# Patient Record
Sex: Female | Born: 1965
Health system: Southern US, Community
[De-identification: ages and names within clinical notes are randomized; demographics above are authoritative.]

## PROBLEM LIST (undated history)

## (undated) DIAGNOSIS — D219 Benign neoplasm of connective and other soft tissue, unspecified: Secondary | ICD-10-CM

## (undated) DIAGNOSIS — Z973 Presence of spectacles and contact lenses: Secondary | ICD-10-CM

## (undated) DIAGNOSIS — F329 Major depressive disorder, single episode, unspecified: Secondary | ICD-10-CM

## (undated) DIAGNOSIS — J189 Pneumonia, unspecified organism: Secondary | ICD-10-CM

## (undated) DIAGNOSIS — F32A Depression, unspecified: Secondary | ICD-10-CM

## (undated) DIAGNOSIS — I10 Essential (primary) hypertension: Secondary | ICD-10-CM

## (undated) DIAGNOSIS — R011 Cardiac murmur, unspecified: Secondary | ICD-10-CM

## (undated) DIAGNOSIS — Z8489 Family history of other specified conditions: Secondary | ICD-10-CM

## (undated) DIAGNOSIS — K635 Polyp of colon: Secondary | ICD-10-CM

## (undated) HISTORY — DX: Depression, unspecified: F32.A

## (undated) HISTORY — PX: NOVASURE ABLATION: SHX5394

## (undated) HISTORY — DX: Essential (primary) hypertension: I10

## (undated) HISTORY — PX: TUBAL LIGATION: SHX77

## (undated) HISTORY — DX: Cardiac murmur, unspecified: R01.1

## (undated) HISTORY — PX: BREAST BIOPSY: SHX20

## (undated) HISTORY — DX: Benign neoplasm of connective and other soft tissue, unspecified: D21.9

## (undated) HISTORY — DX: Major depressive disorder, single episode, unspecified: F32.9

## (undated) HISTORY — DX: Polyp of colon: K63.5

---

## 2003-02-04 ENCOUNTER — Encounter: Payer: Self-pay | Admitting: Family Medicine

## 2003-02-04 ENCOUNTER — Ambulatory Visit (HOSPITAL_COMMUNITY): Admission: RE | Admit: 2003-02-04 | Discharge: 2003-02-04 | Payer: Self-pay | Admitting: Family Medicine

## 2005-06-10 ENCOUNTER — Emergency Department (HOSPITAL_COMMUNITY): Admission: EM | Admit: 2005-06-10 | Discharge: 2005-06-10 | Payer: Self-pay | Admitting: *Deleted

## 2008-12-31 ENCOUNTER — Other Ambulatory Visit: Admission: RE | Admit: 2008-12-31 | Discharge: 2008-12-31 | Payer: Self-pay | Admitting: Obstetrics and Gynecology

## 2009-02-16 ENCOUNTER — Encounter: Payer: Self-pay | Admitting: Obstetrics and Gynecology

## 2009-02-16 ENCOUNTER — Ambulatory Visit (HOSPITAL_COMMUNITY): Admission: RE | Admit: 2009-02-16 | Discharge: 2009-02-17 | Payer: Self-pay | Admitting: Obstetrics and Gynecology

## 2009-02-16 HISTORY — PX: LAPAROSCOPIC TOTAL HYSTERECTOMY: SUR800

## 2011-02-22 LAB — CBC
HCT: 28.8 % — ABNORMAL LOW (ref 36.0–46.0)
HCT: 37.9 % (ref 36.0–46.0)
Hemoglobin: 12.9 g/dL (ref 12.0–15.0)
Hemoglobin: 9.8 g/dL — ABNORMAL LOW (ref 12.0–15.0)
MCHC: 33.9 g/dL (ref 30.0–36.0)
MCHC: 34 g/dL (ref 30.0–36.0)
MCV: 97.2 fL (ref 78.0–100.0)
MCV: 97.6 fL (ref 78.0–100.0)
Platelets: 203 10*3/uL (ref 150–400)
Platelets: 262 10*3/uL (ref 150–400)
RBC: 2.96 MIL/uL — ABNORMAL LOW (ref 3.87–5.11)
RBC: 3.89 MIL/uL (ref 3.87–5.11)
RDW: 13.3 % (ref 11.5–15.5)
RDW: 13.4 % (ref 11.5–15.5)
WBC: 7.4 10*3/uL (ref 4.0–10.5)
WBC: 8 10*3/uL (ref 4.0–10.5)

## 2011-02-22 LAB — ABO/RH: ABO/RH(D): A POS

## 2011-02-22 LAB — PREGNANCY, URINE: Preg Test, Ur: NEGATIVE

## 2011-02-22 LAB — TYPE AND SCREEN
ABO/RH(D): A POS
Antibody Screen: NEGATIVE

## 2011-03-28 NOTE — Op Note (Signed)
NAME:  Kathryn Church, Kathryn Church                  ACCOUNT NO.:  [id number]   MEDICAL RECORD NO.:  [id number]          PATIENT TYPE:  AMB   LOCATION:  DAY                          FACILITY:  [facility]   PHYSICIAN:  Cynthia P. Romine, M.D.DATE OF BIRTH:  August 09, 1966   DATE OF PROCEDURE:  02/16/2009  DATE OF DISCHARGE:                               OPERATIVE REPORT   PREOPERATIVE DIAGNOSES:  1. Menorrhagia.  2. Uterine fibroids.   POSTOPERATIVE DIAGNOSES:  1. Menorrhagia.  2. Uterine fibroids.  Path pending.   PROCEDURE:  Robotic total laparoscopic hysterectomy.   SURGEON:  Dr. Aram Beecham Romine.   ASSISTANT:  Dr. Leda Quail.   ANESTHESIA:  General endotracheal.   BLOOD LOSS:  125 mL.   COMPLICATIONS:  None.   PROCEDURE:  The patient was taken to the operating room and after the  induction of adequate general endotracheal anesthesia, was positioned on  the table and prepped and draped in the usual fashion.  While in dorsal  lithotomy position, a posterior weight and anterior Deaver were placed,  cervix was grasped with a single-tooth tenaculum.  Uterus sounded to 10  cm.  A 10-cm Rumi manipulator with a medium KOH ring was inserted  without difficulty.  The occluder was inflated with 60 mL of air.  A  Foley catheter was then placed.  The legs brought down to a very low  lithotomy position.  Attention was next turned to the abdomen.  A 3-cm  incision was made approximately 3 fingerbreadths above the umbilicus  after infiltrating the skin with 0.25% Marcaine.  The subcutaneous  tissue was bluntly dissected.  The fascia was grasped with Kochers and  entered with Mayo scissors.  The underlying peritoneum was elevated  between hemostats and entered atraumatically.  A pursestring suture of 0  Vicryl was placed around the fascia.  Hasson trocar was then placed into  the abdomen and secured with the pursestring suture around the fascia.  Pneumoperitoneum was then created.  The uterus was seen and  felt to be  quite large, approximately 12-14-week size.  The ovaries on each side  appeared normal as did the tube.  The abdomen was insufflated with the  automatic insufflator with pressure set at 15 mmHg.  Four other small, 1-  2-cm, incisions approximately 10 cm lateral to the supraumbilical  incision.  Again, 10 cm lateral and inferior to those, three 8-mm  robotic trocars were inserted under direct visualization and the 10-12-  mm assistance port was inserted into the right lower quadrant under  direct visualization.  The patient was placed in steep Trendelenburg.  The robot was then docked from the side to allow better access to the  vagina.  A PK Gyrus, the monopolar scissors and a cobra grasper were  used to support the robotic ports.  The surgeon then proceeded to the  console.  The round ligament and utero-ovarian ligaments and tube on  each side were cauterized using the PK Gyrus cut using the monopolar  scissors.  This was done bilaterally.  The anterior and posterior leaf  of the broad  ligament were taken down sharply with monopolar scissors.  The bladder was dissected sharply off the vagina until the KOH ring was  clearly visualized.  The uterine arteries on each side were  skeletonized, coagulated with the PK Gyrus and then cut.  The colpotomy  incision was made using the monopolar cautery circumferentially around  the KOH ring.  Once the specimen was removed from the patient's vaginal  cuff, the surgeon scrubbed and vaginally morcellated the uterus and  removed it through the vagina, where all the pieces were put together  and weighed in the operating room, it weighed 844 grams.  The cuff was  closed vaginally with pop-offs of 0 Vicryl figure-of-eight sutures  across the cuff.  Good hemostasis was noted.  The operator then went  back to the robot.  The assistant used the irrigator aspirator to  irrigate the cuff, it was hemostatic.  The pedicles were all inspected  and  were hemostatic.  The ureters were visualized bilaterally and seen  to peristals.  Instruments were then removed from the abdomen and the  trocars were also removed under direct visualization.  Pneumoperitoneum  was allowed to escape.  The pursestring suture at the subumbilical  incision was tied to close the fascia.  Two sutures of 0 Vicryl were  used to close the fascia at the assistance port.  The skin of the  assistance port and the supraumbilical incision were closed  subcuticularly with 4-0 Vicryl, Rapide and Steri-Strips.  The remaining  3 incisions were just closed with Steri-Strips and Benzoin and dressing  applied.  The procedure was terminated.  Instruments were all removed  from the vagina.  The patient taken in satisfactory condition to post  anesthesia recovery.  Sponge and instrument counts were correct.      Cynthia P. Romine, M.D.  Electronically Signed     CPR/MEDQ  D:  02/16/2009  T:  02/16/2009  Job:  [id number]

## 2012-10-24 ENCOUNTER — Other Ambulatory Visit (HOSPITAL_COMMUNITY): Payer: Self-pay | Admitting: Physician Assistant

## 2012-10-24 DIAGNOSIS — L729 Follicular cyst of the skin and subcutaneous tissue, unspecified: Secondary | ICD-10-CM

## 2012-10-25 ENCOUNTER — Other Ambulatory Visit (HOSPITAL_COMMUNITY): Payer: Self-pay | Admitting: Physician Assistant

## 2012-10-25 ENCOUNTER — Ambulatory Visit (HOSPITAL_COMMUNITY)
Admission: RE | Admit: 2012-10-25 | Discharge: 2012-10-25 | Disposition: A | Discharge status: Home / Self Care | Payer: BC Managed Care – PPO | Source: Ambulatory Visit | Attending: Physician Assistant | Admitting: Physician Assistant

## 2012-10-25 DIAGNOSIS — L729 Follicular cyst of the skin and subcutaneous tissue, unspecified: Secondary | ICD-10-CM

## 2012-10-25 DIAGNOSIS — L723 Sebaceous cyst: Secondary | ICD-10-CM | POA: Insufficient documentation

## 2013-04-08 ENCOUNTER — Encounter: Payer: Self-pay | Admitting: Obstetrics & Gynecology

## 2013-04-08 ENCOUNTER — Ambulatory Visit (INDEPENDENT_AMBULATORY_CARE_PROVIDER_SITE_OTHER): Payer: BC Managed Care – PPO | Admitting: Obstetrics & Gynecology

## 2013-04-08 VITALS — BP 110/72 | HR 68 | Resp 16 | Ht 66.0 in | Wt 130.0 lb

## 2013-04-08 DIAGNOSIS — Z01419 Encounter for gynecological examination (general) (routine) without abnormal findings: Secondary | ICD-10-CM

## 2013-04-08 DIAGNOSIS — Z23 Encounter for immunization: Secondary | ICD-10-CM

## 2013-04-08 DIAGNOSIS — Z0000 Encounter for general adult medical examination without abnormal findings: Secondary | ICD-10-CM

## 2013-04-08 LAB — POCT URINALYSIS DIPSTICK
Blood, UA: NEGATIVE
Glucose, UA: NEGATIVE

## 2013-04-08 MED ORDER — DULOXETINE HCL 30 MG PO CPEP: 30.0000 mg | ORAL_CAPSULE | Freq: Every day | ORAL | Status: DC

## 2013-04-08 MED ORDER — DOXYCYCLINE HYCLATE 100 MG PO CAPS: 100.0000 mg | ORAL_CAPSULE | Freq: Two times a day (BID) | ORAL | Status: DC

## 2013-04-08 NOTE — Patient Instructions (Signed)

## 2013-04-08 NOTE — Addendum Note (Signed)
Addended by: Dorinda Hill on: 04/08/2013 03:55 PM   Modules accepted: Orders

## 2013-04-08 NOTE — Progress Notes (Signed)
47 y.o. G3P3 SingleCaucasianF here for annual exam.  Expecting a new granddaughter in August.  Needs TDaP.  No vaginal bleeding.  Reports going to ER for UTI in the last year.  Had new sexual partner this year.  She feels this is the cause.    Having more issues with hot flashes.  Also, having night sweats.  This is disturbing her sleep.  Wants some suggestions about OTC products.  Doesn't want to be estrogen.  No LMP recorded. Patient has had a hysterectomy.          Sexually active: yes  The current method of family planning is status post hysterectomy.    Exercising: no  The patient does not participate in regular exercise at present. Smoker:  yes  Health Maintenance: Pap:  12/31/08 History of abnormal Pap:yes, 2008--ASCUS +HR HPV MMG:  01/01/13 Colonoscopy:  n/a BMD:  n/a TDaP:  Wants today Screening Labs: Done with PCP 2013, Urine today:Normal   reports that she has been smoking Cigarettes.  She has been smoking about 0.00 packs per day. She has never used smokeless tobacco. She reports that she does not drink alcohol or use illicit drugs.  Past Medical History  Diagnosis Date  . Fibroid     Past Surgical History  Procedure Laterality Date  . Abdominal hysterectomy    . Novasure ablation      Current Outpatient Prescriptions  Medication Sig Dispense Refill  . DULoxetine (CYMBALTA) 30 MG capsule Take 30 mg by mouth daily.       No current facility-administered medications for this visit.    Family History  Problem Relation Age of Onset  . Rheum arthritis Mother   . Hypertension Mother   . Alcoholism Father   . Stroke Maternal Grandmother     mini strokes  . Heart disease Maternal Grandfather     pacemaker    ROS:  Pertinent items are noted in HPI.  Otherwise, a comprehensive ROS was negative.  Exam:   BP 110/72  Pulse 68  Resp 16  Ht 5\' 6"  (1.676 m)  Wt 130 lb (58.968 kg)  BMI 20.99 kg/m2  Weight change: +4lbs   Height: 5\' 6"  (167.6 cm)  Ht Readings from  Last 3 Encounters:  04/08/13 5\' 6"  (1.676 m)    General appearance: alert, cooperative and appears stated age Head: Normocephalic, without obvious abnormality, atraumatic Neck: no adenopathy, supple, symmetrical, trachea midline and thyroid normal to inspection and palpation Lungs: clear to auscultation bilaterally Breasts: normal appearance, no masses or tenderness Heart: regular rate and rhythm Abdomen: soft, non-tender; bowel sounds normal; no masses,  no organomegaly Extremities: extremities normal, atraumatic, no cyanosis or edema Skin: Skin color, texture, turgor normal. No rashes or lesions Lymph nodes: Cervical, supraclavicular, and axillary nodes normal. No abnormal inguinal nodes palpated Neurologic: Grossly normal   Pelvic: External genitalia:  no lesions              Urethra:  normal appearing urethra with no masses, tenderness or lesions              Bartholins and Skenes: normal                 Vagina: normal appearing vagina with normal color and discharge, no lesions              Cervix: absent              Pap taken: no Bimanual Exam:  Uterus:  uterus absent  Adnexa: normal adnexa and no mass, fullness, tenderness               Rectovaginal: Confirms               Anus:  normal sphincter tone, no lesions  A:  Well Woman with normal exam H/o TLH, ovaries remain Vasomotor symptoms, FSH 80 5/13. Recent tick bites  P:   Mammogram yearly. Planning fasting labs next year here.  Will schedule an early morning appt. Cymbalta 30mg  qd to pharmacy. Doxycycline 100mg  bid x 10 days.  RX to pharmacy. Tdap given today. return annually or prn  An After Visit Summary was printed and given to the patient.

## 2013-12-10 ENCOUNTER — Encounter: Payer: Self-pay | Admitting: Obstetrics & Gynecology

## 2014-05-01 ENCOUNTER — Ambulatory Visit: Payer: BC Managed Care – PPO | Admitting: Obstetrics & Gynecology

## 2014-05-05 ENCOUNTER — Encounter: Payer: Self-pay | Admitting: Obstetrics & Gynecology

## 2014-05-07 ENCOUNTER — Ambulatory Visit (INDEPENDENT_AMBULATORY_CARE_PROVIDER_SITE_OTHER): Payer: BC Managed Care – PPO | Admitting: Obstetrics & Gynecology

## 2014-05-07 ENCOUNTER — Encounter: Payer: Self-pay | Admitting: Obstetrics & Gynecology

## 2014-05-07 VITALS — BP 118/64 | HR 64 | Resp 16 | Ht 66.5 in | Wt 132.0 lb

## 2014-05-07 DIAGNOSIS — Z0000 Encounter for general adult medical examination without abnormal findings: Secondary | ICD-10-CM

## 2014-05-07 DIAGNOSIS — Z01419 Encounter for gynecological examination (general) (routine) without abnormal findings: Secondary | ICD-10-CM

## 2014-05-07 LAB — COMPREHENSIVE METABOLIC PANEL
ALBUMIN: 4.4 g/dL (ref 3.5–5.2)
ALK PHOS: 69 U/L (ref 39–117)
ALT: 13 U/L (ref 0–35)
AST: 15 U/L (ref 0–37)
BILIRUBIN TOTAL: 0.6 mg/dL (ref 0.2–1.2)
BUN: 11 mg/dL (ref 6–23)
CO2: 28 mEq/L (ref 19–32)
Calcium: 9.9 mg/dL (ref 8.4–10.5)
Chloride: 106 mEq/L (ref 96–112)
Creat: 0.77 mg/dL (ref 0.50–1.10)
GLUCOSE: 92 mg/dL (ref 70–99)
POTASSIUM: 4.3 meq/L (ref 3.5–5.3)
SODIUM: 138 meq/L (ref 135–145)
TOTAL PROTEIN: 6.8 g/dL (ref 6.0–8.3)

## 2014-05-07 LAB — LIPID PANEL
CHOL/HDL RATIO: 3.9 ratio
CHOLESTEROL: 164 mg/dL (ref 0–200)
HDL: 42 mg/dL (ref >39)
LDL CALC: 102 mg/dL — AB (ref 0–99)
Triglycerides: 98 mg/dL (ref <150)
VLDL: 20 mg/dL (ref 0–40)

## 2014-05-07 MED ORDER — DULOXETINE HCL 30 MG PO CPEP: 30.0000 mg | ORAL_CAPSULE | Freq: Every day | ORAL | Status: DC

## 2014-05-07 NOTE — Progress Notes (Signed)
48 y.o. G3P3 SingleCaucasianF here for annual exam.  No vaginal bleeding.  Having some hot flashes.  Having a little more issues with incontinence with kick boxing.  Not sexually active.  Has an almost one year old granddaughter--Elena Faith.    Has some questions about the surgery she had done at [facility].  States she getting calls from lawyers, letters, and she is not really sure what this is about.  Reviewed operative note with patient.  No evidence of use of mesh.  I am not sure why she is getting this communication.   Patient's last menstrual period was 02/16/2009.          Sexually active: no  The current method of family planning is status post hysterectomy.    Exercising: yes  kickboxing Smoker:  Yes one PPD  Health Maintenance: Pap:  12/31/08 WNL History of abnormal Pap:  no MMG:  [facility], states she will still schedule Colonoscopy:  none BMD:   none TDaP:  04/08/13 Screening Labs: today, Hb today: 14.0, Urine today: PROTEIN-trace, RBC-trace, PH-7   reports that she has been smoking Cigarettes.  She has been smoking about 1.00 pack per day. She has never used smokeless tobacco. She reports that she does not drink alcohol or use illicit drugs.  Past Medical History  Diagnosis Date  . Fibroid   . Depression     Past Surgical History  Procedure Laterality Date  . Abdominal hysterectomy  02/16/09    R-TLH  . Novasure ablation    . Tubal ligation    . Breast biopsy      benign    Current Outpatient Prescriptions  Medication Sig Dispense Refill  . DULoxetine (CYMBALTA) 30 MG capsule Take 1 capsule (30 mg total) by mouth daily.  90 capsule  4   No current facility-administered medications for this visit.    Family History  Problem Relation Age of Onset  . Rheum arthritis Mother   . Hypertension Mother   . Alcoholism Father   . Stroke Maternal Grandmother     mini strokes  . Heart disease Maternal Grandfather     pacemaker  . Other Brother    vasculitis disease-unsure of all diag  . Diabetes Maternal Grandfather   . Heart disease Paternal Grandfather     ROS:  Pertinent items are noted in HPI.  Otherwise, a comprehensive ROS was negative.  Exam:   BP 118/64  Pulse 64  Resp 16  Ht 5' 6.5" (1.689 m)  Wt 132 lb (59.875 kg)  BMI 20.99 kg/m2  LMP 02/16/2009     Height: 5' 6.5" (168.9 cm)  Ht Readings from Last 3 Encounters:  05/07/14 5' 6.5" (1.689 m)  04/08/13 5\' 6"  (1.676 m)    General appearance: alert, cooperative and appears stated age Head: Normocephalic, without obvious abnormality, atraumatic Neck: no adenopathy, supple, symmetrical, trachea midline and thyroid normal to inspection and palpation Lungs: clear to auscultation bilaterally Breasts: normal appearance, no masses or tenderness Heart: regular rate and rhythm Abdomen: soft, non-tender; bowel sounds normal; no masses,  no organomegaly Extremities: extremities normal, atraumatic, no cyanosis or edema Skin: Skin color, texture, turgor normal. No rashes or lesions Lymph nodes: Cervical, supraclavicular, and axillary nodes normal. No abnormal inguinal nodes palpated Neurologic: Grossly normal   Pelvic: External genitalia:  no lesions              Urethra:  normal appearing urethra with no masses, tenderness or lesions  Bartholins and Skenes: normal                 Vagina: normal appearing vagina with normal color and discharge, no lesions              Cervix: absent              Pap taken: no Bimanual Exam:  Uterus:  uterus absent              Adnexa: normal adnexa and no mass, fullness, tenderness               Rectovaginal: Confirms               Anus:  normal sphincter tone, no lesions  A:  Well Woman with normal exam  H/o TLH, ovaries remain  Vasomotor symptoms, FSH 80 5/13.  Increased urinary incontinence  P: Mammogram yearly.  CMP, TSH, Vit D Cymbalta 30mg  qd to pharmacy.  90 day supply/4RF D/W pt Kegel exercises.  PT referral  discussed.  Declines for now. return annually or prn  An After Visit Summary was printed and given to the patient.

## 2014-05-08 ENCOUNTER — Telehealth: Payer: Self-pay

## 2014-05-08 LAB — VITAMIN D 25 HYDROXY (VIT D DEFICIENCY, FRACTURES): Vit D, 25-Hydroxy: 55 ng/mL (ref 30–89)

## 2014-05-08 LAB — TSH: TSH: 0.872 u[IU]/mL (ref 0.350–4.500)

## 2014-05-08 LAB — HEMOGLOBIN, FINGERSTICK: Hemoglobin, fingerstick: 14 g/dL (ref 12.0–16.0)

## 2014-05-08 NOTE — Telephone Encounter (Signed)
Patient notified of all results.//kn 

## 2014-05-08 NOTE — Telephone Encounter (Signed)
Message copied by Robley Fries on Fri May 08, 2014  2:52 PM ------      Message from: Megan Salon      Created: Fri May 08, 2014 12:19 PM       Inform pt cholesterol is fine.  LDLs are a tiny but above normal but HDLs are fine.  TGs are normal.  Ratio is good.  TSH, CMP, and Vit D all normal. ------

## 2014-05-08 NOTE — Telephone Encounter (Signed)
Lmtcb//kn 

## 2014-05-08 NOTE — Telephone Encounter (Signed)
Returning a call to Kathryn Church °

## 2014-06-30 ENCOUNTER — Other Ambulatory Visit: Payer: Self-pay | Admitting: Obstetrics & Gynecology

## 2014-09-14 ENCOUNTER — Encounter: Payer: Self-pay | Admitting: Obstetrics & Gynecology

## 2014-12-06 IMAGING — US US ABDOMEN LIMITED
1 series · 1 of 1 positions shown · non-contrast
Comparison: None.

CLINICAL DATA: Evaluate knot on the back.

ABDOMEN ULTRASOUND LIMITED
TECHNIQUE: Ultrasound was performed over the area of interest.

[Series 1: us abdomen limited · 0.08mm/px · 1 of 1 slices shown]
[im 1/1]
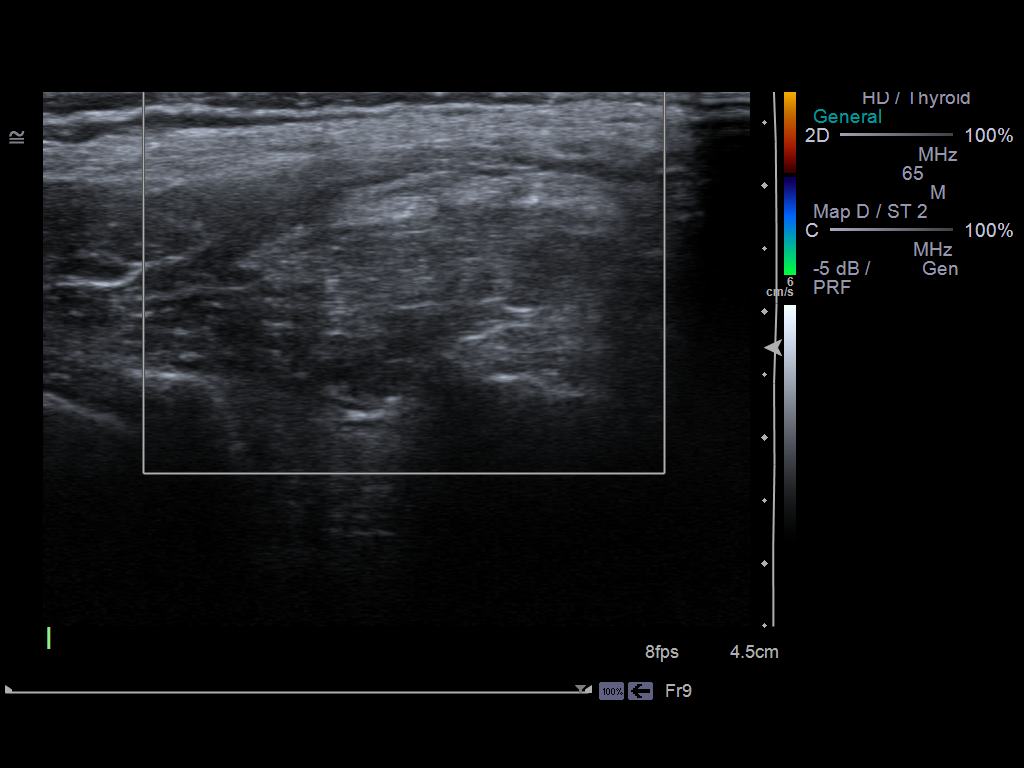

[1 of 1 positions shown; findings below may reference images not displayed]

FINDINGS: The area of interest is in the left back paraspinal
region.  There is an ill-defined solid and slightly hyperechoic
structure that corresponds with the palpable finding.  This area
measures 3.9 x 1.0 x 2.4 cm.  There is no significant vascular flow
within the structure.  The structure appears to be within the
paraspinal musculature.
IMPRESSION: There is a 3.9 cm solid lesion in the area of concern.  The lesion
appears to be within the paraspinal muscles and this could
represent an intramuscular lipoma.  However, this solid lesion is
nonspecific by ultrasound.

## 2014-12-06 IMAGING — US US ABDOMEN LIMITED
1 series · 12 of 12 positions shown · non-contrast
Comparison: None.

CLINICAL DATA: Evaluate knot on the back.

ABDOMEN ULTRASOUND LIMITED
TECHNIQUE: Ultrasound was performed over the area of interest.

[Series 1: us abdomen limited · 0.06mm/px · 12 of 12 slices shown]
[im 1/12]
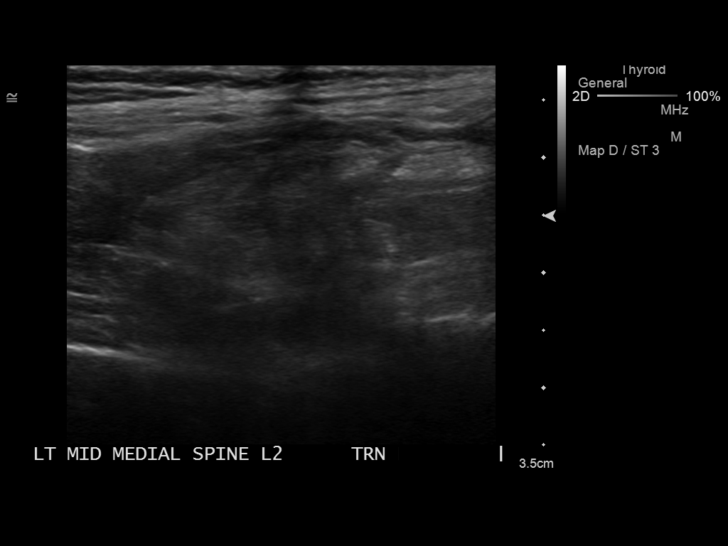
[im 2/12]
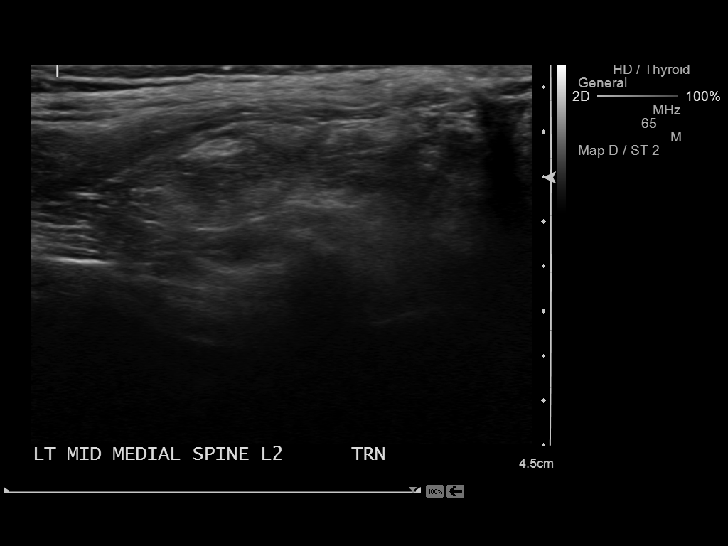
[im 3/12]
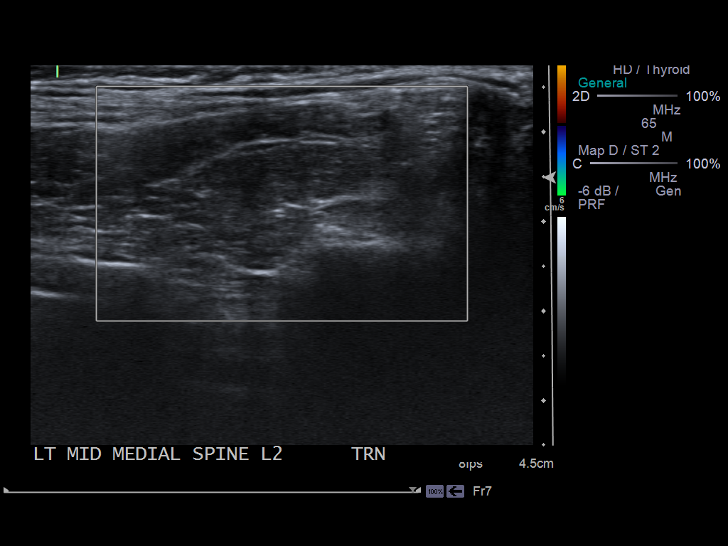
[im 4/12]
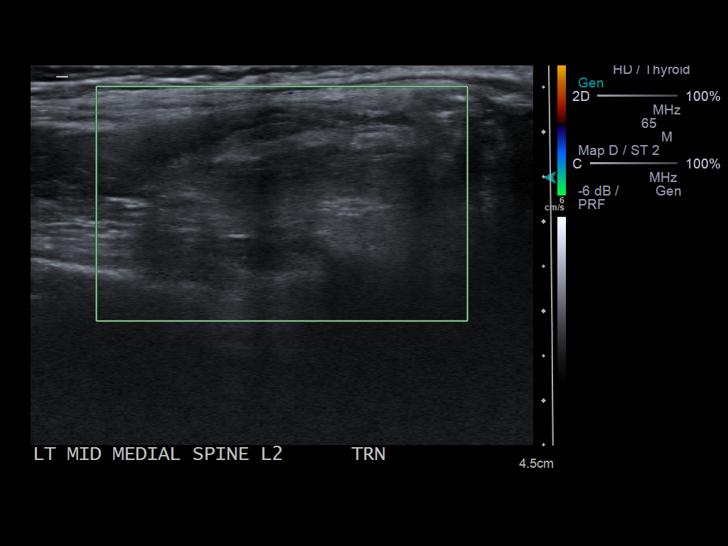
[im 5/12]
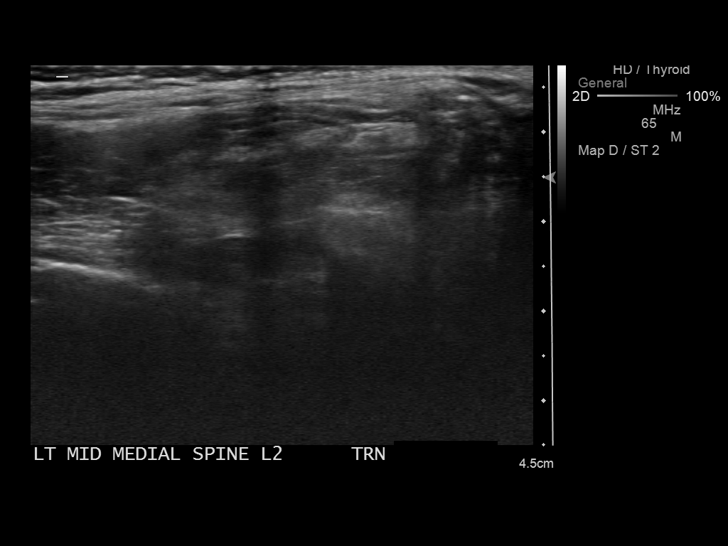
[im 6/12]
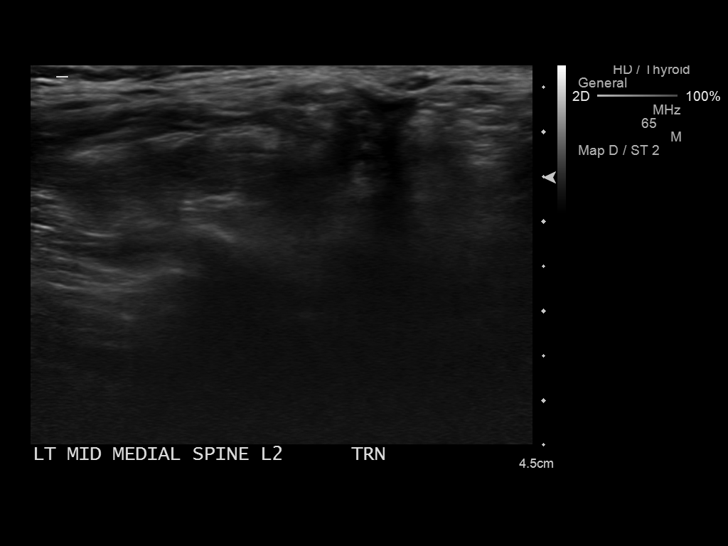
[im 7/12]
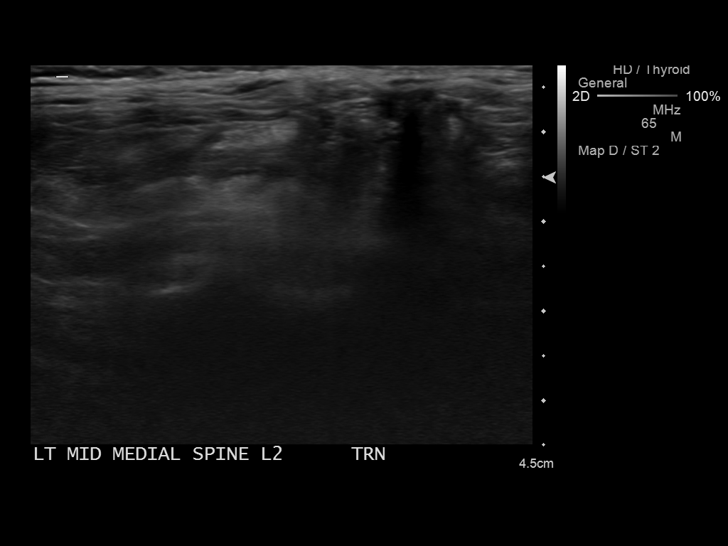
[im 8/12]
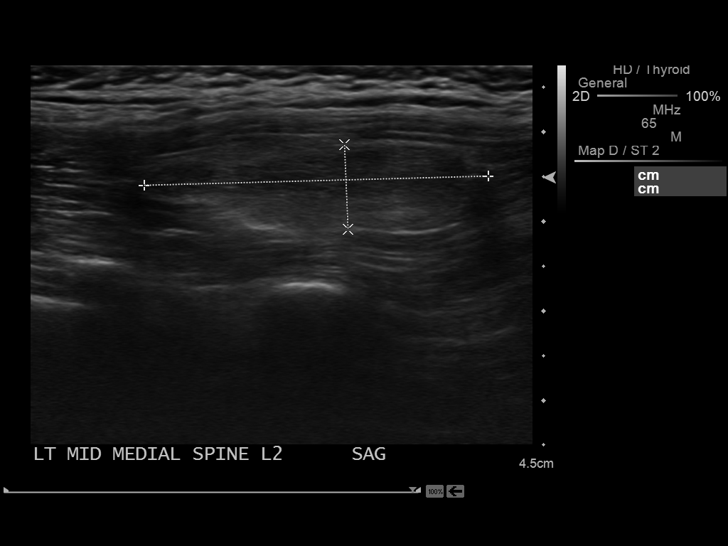
[im 9/12]
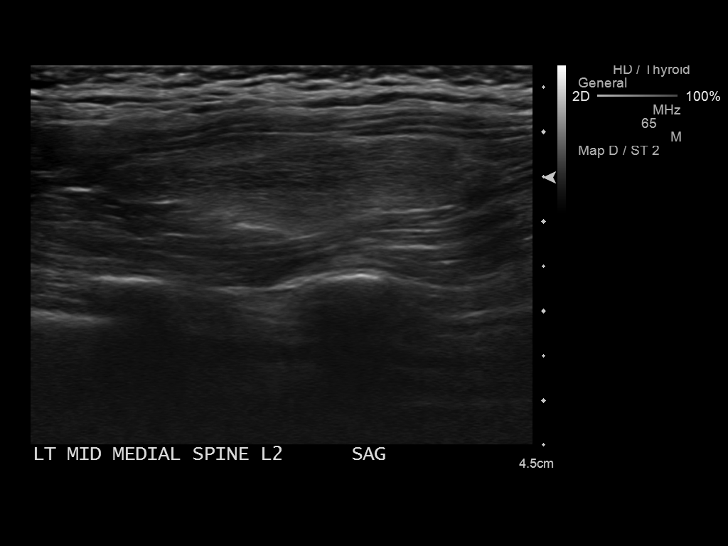
[im 10/12]
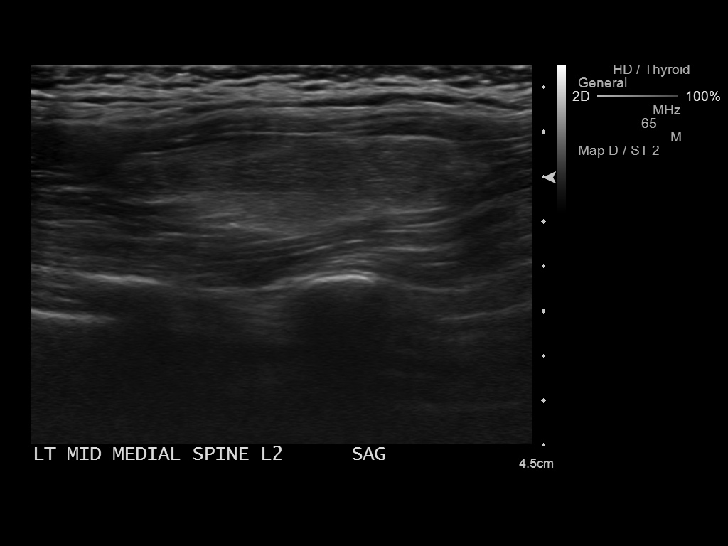
[im 11/12]
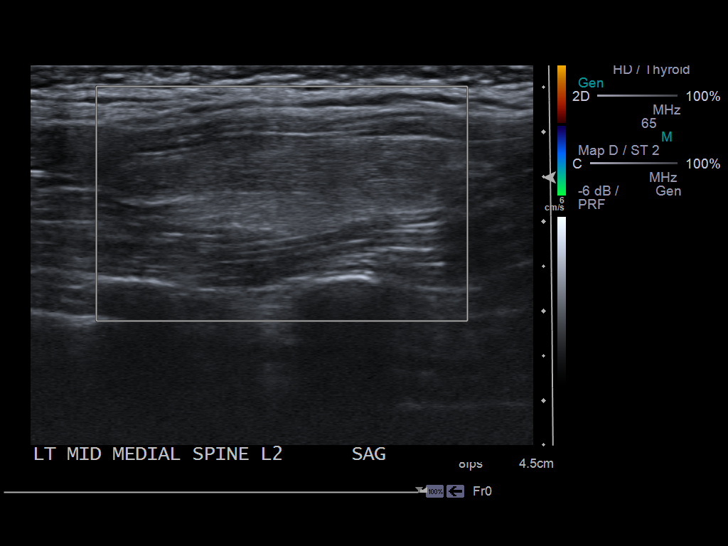
[im 12/12]
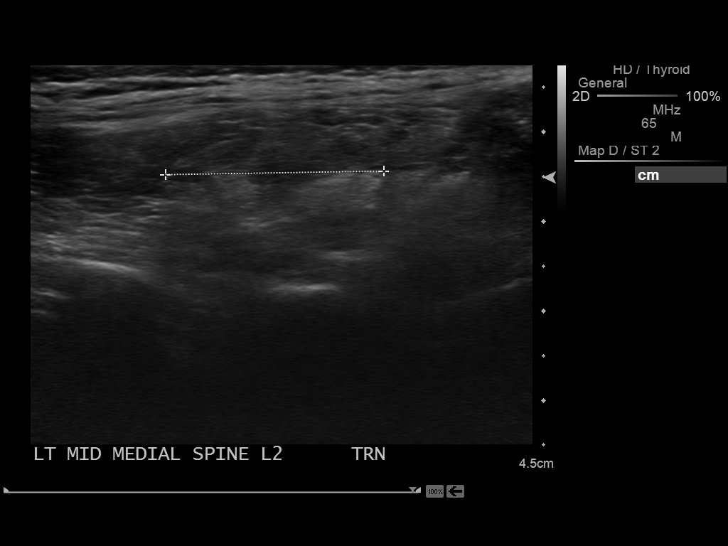

[12 of 12 positions shown; findings below may reference images not displayed]

FINDINGS: The area of interest is in the left back paraspinal
region.  There is an ill-defined solid and slightly hyperechoic
structure that corresponds with the palpable finding.  This area
measures 3.9 x 1.0 x 2.4 cm.  There is no significant vascular flow
within the structure.  The structure appears to be within the
paraspinal musculature.
IMPRESSION: There is a 3.9 cm solid lesion in the area of concern.  The lesion
appears to be within the paraspinal muscles and this could
represent an intramuscular lipoma.  However, this solid lesion is
nonspecific by ultrasound.

## 2015-05-11 ENCOUNTER — Ambulatory Visit (INDEPENDENT_AMBULATORY_CARE_PROVIDER_SITE_OTHER): Payer: BLUE CROSS/BLUE SHIELD | Admitting: Obstetrics & Gynecology

## 2015-05-11 ENCOUNTER — Encounter: Payer: Self-pay | Admitting: Obstetrics & Gynecology

## 2015-05-11 VITALS — BP 112/80 | HR 64 | Resp 16 | Ht 66.25 in | Wt 138.0 lb

## 2015-05-11 DIAGNOSIS — Z0000 Encounter for general adult medical examination without abnormal findings: Secondary | ICD-10-CM

## 2015-05-11 DIAGNOSIS — Z1211 Encounter for screening for malignant neoplasm of colon: Secondary | ICD-10-CM | POA: Diagnosis not present

## 2015-05-11 DIAGNOSIS — Z01419 Encounter for gynecological examination (general) (routine) without abnormal findings: Secondary | ICD-10-CM

## 2015-05-11 LAB — POCT URINALYSIS DIPSTICK
BILIRUBIN UA: NEGATIVE
GLUCOSE UA: NEGATIVE
Ketones, UA: NEGATIVE
Leukocytes, UA: NEGATIVE
NITRITE UA: NEGATIVE
Protein, UA: NEGATIVE
SPEC GRAV UA: 1.02
UROBILINOGEN UA: NEGATIVE
pH, UA: 6.5

## 2015-05-11 LAB — COMPREHENSIVE METABOLIC PANEL
ALK PHOS: 72 U/L (ref 39–117)
ALT: 11 U/L (ref 0–35)
AST: 16 U/L (ref 0–37)
Albumin: 4.3 g/dL (ref 3.5–5.2)
BUN: 14 mg/dL (ref 6–23)
CO2: 25 mEq/L (ref 19–32)
Calcium: 10 mg/dL (ref 8.4–10.5)
Chloride: 106 mEq/L (ref 96–112)
Creat: 0.7 mg/dL (ref 0.50–1.10)
GLUCOSE: 80 mg/dL (ref 70–99)
Potassium: 4.3 mEq/L (ref 3.5–5.3)
SODIUM: 141 meq/L (ref 135–145)
Total Bilirubin: 0.5 mg/dL (ref 0.2–1.2)
Total Protein: 6.8 g/dL (ref 6.0–8.3)

## 2015-05-11 LAB — LIPID PANEL
CHOL/HDL RATIO: 4.2 ratio
Cholesterol: 182 mg/dL (ref 0–200)
HDL: 43 mg/dL — ABNORMAL LOW (ref >=46)
LDL CALC: 120 mg/dL — AB (ref 0–99)
TRIGLYCERIDES: 97 mg/dL (ref <150)
VLDL: 19 mg/dL (ref 0–40)

## 2015-05-11 LAB — CBC
HCT: 40.7 % (ref 36.0–46.0)
Hemoglobin: 13.7 g/dL (ref 12.0–15.0)
MCH: 32 pg (ref 26.0–34.0)
MCHC: 33.7 g/dL (ref 30.0–36.0)
MCV: 95.1 fL (ref 78.0–100.0)
MPV: 10 fL (ref 8.6–12.4)
Platelets: 301 10*3/uL (ref 150–400)
RBC: 4.28 MIL/uL (ref 3.87–5.11)
RDW: 13.1 % (ref 11.5–15.5)
WBC: 5.6 10*3/uL (ref 4.0–10.5)

## 2015-05-11 LAB — TSH: TSH: 1.25 u[IU]/mL (ref 0.350–4.500)

## 2015-05-11 MED ORDER — DULOXETINE HCL 30 MG PO CPEP: 30.0000 mg | ORAL_CAPSULE | Freq: Every day | ORAL | Status: DC

## 2015-05-11 NOTE — Addendum Note (Signed)
Addended by: Megan Salon on: 05/11/2015 11:32 AM   Modules accepted: Miquel Dunn

## 2015-05-11 NOTE — Progress Notes (Signed)
Patient ID: Kathryn Church, female   DOB: 1966/03/30, 49 y.o.   MRN: [id number]   49 y.o. G3P3 SingleCaucasianF here for annual exam.  Doing well.  PCP has retired.  He was in [place].  Needs blood work.  No vaginal bleeding.  Closing [employer] in [place].  No vaginal bleeding since her hysterectomy.  Reports she has done really well since then.   Patient's last menstrual period was 02/16/2009.          Sexually active: No.  The current method of family planning is status post hysterectomy.    Exercising: No.  The patient does not participate in regular exercise at present. Smoker:  Yes   Health Maintenance: Pap:  01-10-09 WNL History of abnormal Pap:  no MMG:  01-01-2013 WNL  Colonoscopy:  Never BMD:   Never TDaP:  04-08-13 Screening Labs: fasting labs drawn today , Hb today: 13.7, Urine today: trace of RBC  reports that she has been smoking Cigarettes.  She has been smoking about 1.00 pack per day. She has never used smokeless tobacco. She reports that she does not drink alcohol or use illicit drugs.  Past Medical History  Diagnosis Date  . Fibroid   . Depression     Past Surgical History  Procedure Laterality Date  . Laparoscopic total hysterectomy  02/16/09    Robotic assisted  . Novasure ablation    . Tubal ligation    . Breast biopsy      benign    Current Outpatient Prescriptions  Medication Sig Dispense Refill  . DULoxetine (CYMBALTA) 30 MG capsule Take 1 capsule (30 mg total) by mouth daily. 90 capsule 4   No current facility-administered medications for this visit.    Family History  Problem Relation Age of Onset  . Rheum arthritis Mother   . Hypertension Mother   . Alcoholism Father   . Stroke Maternal Grandmother     mini strokes  . Heart disease Maternal Grandfather     pacemaker  . Other Brother     vasculitis disease-unsure of all diag  . Diabetes Maternal Grandfather   . Heart disease Paternal Grandfather     ROS:  Pertinent items are noted  in HPI.  Otherwise, a comprehensive ROS was negative.  Exam:   LMP 02/16/2009      Ht Readings from Last 3 Encounters:  05/07/14 5' 6.5" (1.689 m)  04/08/13 5\' 6"  (1.676 m)   General appearance: alert, cooperative and appears stated age Head: Normocephalic, without obvious abnormality, atraumatic Neck: no adenopathy, supple, symmetrical, trachea midline and thyroid normal to inspection and palpation Lungs: clear to auscultation bilaterally Breasts: normal appearance, no masses or tenderness Heart: regular rate and rhythm Abdomen: soft, non-tender; bowel sounds normal; no masses,  no organomegaly Extremities: extremities normal, atraumatic, no cyanosis or edema Skin: Skin color, texture, turgor normal. No rashes or lesions Lymph nodes: Cervical, supraclavicular, and axillary nodes normal. No abnormal inguinal nodes palpated Neurologic: Grossly normal   Pelvic: External genitalia:  no lesions              Urethra:  normal appearing urethra with no masses, tenderness or lesions              Bartholins and Skenes: normal                 Vagina: normal appearing vagina with normal color and discharge, no lesions  Cervix: absent              Pap taken: No. Bimanual Exam:  Uterus:  uterus absent              Adnexa: no mass, fullness, tenderness               Rectovaginal: Confirms               Anus:  normal sphincter tone, no lesions  Chaperone was present for exam.  A:  Well Woman with normal exam  H/o TLH, ovaries remain  Menopausal Increased urinary incontinence  P: Mammogram yearly CMP, TSH, Vit D, CBC, Lipids Cymbalta 30mg  qd to pharmacy. 90 day supply/4RF D/W pt Kegel exercises. PT referral discussed. Declines for now. return annually or prn

## 2015-05-11 NOTE — Patient Instructions (Addendum)
Estroven pm Black cohosh 30mg  can take twice daily

## 2015-05-12 LAB — VITAMIN D 25 HYDROXY (VIT D DEFICIENCY, FRACTURES): Vit D, 25-Hydroxy: 42 ng/mL (ref 30–100)

## 2015-05-15 ENCOUNTER — Other Ambulatory Visit: Payer: Self-pay | Admitting: Obstetrics & Gynecology

## 2015-05-18 ENCOUNTER — Ambulatory Visit: Payer: BC Managed Care – PPO | Admitting: Obstetrics & Gynecology

## 2015-05-18 LAB — HEMOGLOBIN, FINGERSTICK: Hemoglobin, fingerstick: 13.7 g/dL (ref 12.0–16.0)

## 2015-05-18 NOTE — Telephone Encounter (Signed)
05/11/15 #90/4 rfs was sent to [facility] in [place]-Denied.

## 2015-05-19 ENCOUNTER — Ambulatory Visit: Payer: Self-pay | Admitting: Obstetrics & Gynecology

## 2015-06-15 ENCOUNTER — Other Ambulatory Visit: Payer: Self-pay | Admitting: Obstetrics & Gynecology

## 2015-06-16 NOTE — Telephone Encounter (Signed)
05/11/15 #90/4 rfs sent to [facility] in [place]-rx denied.

## 2015-07-15 ENCOUNTER — Telehealth: Payer: Self-pay | Admitting: Obstetrics & Gynecology

## 2015-07-15 NOTE — Telephone Encounter (Signed)
Called patient to review status of referral. Called Dr Roseanne Kaufman office and they are requesting information refaxed. They state they are scheduling around the date requested from ms Rosana Hoes and should be able to schedule her then.

## 2015-08-02 NOTE — Telephone Encounter (Signed)
Contacted Dr Roseanne Kaufman office for status update on referral appointment. Scheduler states the nurse mailed a letter to Kathryn Church on 07/15/15 requesting she contact their office to schedule. Scheduler states they have not received any return communication and the patient is not scheduled. I called patient to follow up on this and provide contact information to Dr Rourk's office should she need it. Call unanswered. Voicemail left for return call.  Routing to Dr Sabra Heck for review.

## 2016-05-17 ENCOUNTER — Telehealth: Payer: Self-pay | Admitting: Obstetrics & Gynecology

## 2016-05-17 NOTE — Telephone Encounter (Signed)
Return call to patient. Reports onset of bright red rectal bleeding that has her very scared. Denies any pain. Unsure if should proceed directly to GI for colonoscopy.  Last seen 05-11-15 for annual with Dr Sabra Heck and schedule for 08-10-16 with Dr Sabra Heck. Advised Dr Sabra Heck out of office this week but can see Dr Talbert Nan for evaluation tomorrow and then determine next step. Patient agreeable, appointment 05-18-16 at 0900.  Routing to provider for final review. Patient agreeable to disposition. Will close encounter.

## 2016-05-17 NOTE — Telephone Encounter (Signed)
Patient is having some irregular bleeding and would like an appointment with [place].

## 2016-05-18 ENCOUNTER — Encounter: Payer: Self-pay | Admitting: Obstetrics and Gynecology

## 2016-05-18 ENCOUNTER — Ambulatory Visit (INDEPENDENT_AMBULATORY_CARE_PROVIDER_SITE_OTHER): Payer: Managed Care, Other (non HMO) | Admitting: Obstetrics and Gynecology

## 2016-05-18 VITALS — BP 122/80 | HR 84 | Resp 16 | Ht 66.0 in | Wt 147.0 lb

## 2016-05-18 DIAGNOSIS — R61 Generalized hyperhidrosis: Secondary | ICD-10-CM | POA: Diagnosis not present

## 2016-05-18 DIAGNOSIS — Z0000 Encounter for general adult medical examination without abnormal findings: Secondary | ICD-10-CM

## 2016-05-18 DIAGNOSIS — N951 Menopausal and female climacteric states: Secondary | ICD-10-CM

## 2016-05-18 DIAGNOSIS — Z1211 Encounter for screening for malignant neoplasm of colon: Secondary | ICD-10-CM | POA: Diagnosis not present

## 2016-05-18 DIAGNOSIS — Z01419 Encounter for gynecological examination (general) (routine) without abnormal findings: Secondary | ICD-10-CM | POA: Diagnosis not present

## 2016-05-18 DIAGNOSIS — K625 Hemorrhage of anus and rectum: Secondary | ICD-10-CM

## 2016-05-18 DIAGNOSIS — R232 Flushing: Secondary | ICD-10-CM

## 2016-05-18 DIAGNOSIS — K5900 Constipation, unspecified: Secondary | ICD-10-CM

## 2016-05-18 LAB — CBC
HCT: 40 % (ref 35.0–45.0)
Hemoglobin: 13.4 g/dL (ref 11.7–15.5)
MCH: 32.2 pg (ref 27.0–33.0)
MCHC: 33.5 g/dL (ref 32.0–36.0)
MCV: 96.2 fL (ref 80.0–100.0)
MPV: 9.9 fL (ref 7.5–12.5)
PLATELETS: 291 10*3/uL (ref 140–400)
RBC: 4.16 MIL/uL (ref 3.80–5.10)
RDW: 12.8 % (ref 11.0–15.0)
WBC: 8 10*3/uL (ref 3.8–10.8)

## 2016-05-18 LAB — COMPREHENSIVE METABOLIC PANEL
ALT: 24 U/L (ref 6–29)
AST: 23 U/L (ref 10–35)
Albumin: 4.2 g/dL (ref 3.6–5.1)
Alkaline Phosphatase: 77 U/L (ref 33–130)
BUN: 19 mg/dL (ref 7–25)
CHLORIDE: 103 mmol/L (ref 98–110)
CO2: 27 mmol/L (ref 20–31)
CREATININE: 0.9 mg/dL (ref 0.50–1.05)
Calcium: 10.1 mg/dL (ref 8.6–10.4)
GLUCOSE: 87 mg/dL (ref 65–99)
Potassium: 4.3 mmol/L (ref 3.5–5.3)
SODIUM: 140 mmol/L (ref 135–146)
TOTAL PROTEIN: 6.9 g/dL (ref 6.1–8.1)
Total Bilirubin: 0.4 mg/dL (ref 0.2–1.2)

## 2016-05-18 LAB — LIPID PANEL
CHOLESTEROL: 209 mg/dL — AB (ref 125–200)
HDL: 40 mg/dL — ABNORMAL LOW (ref >=46)
LDL Cholesterol: 140 mg/dL — ABNORMAL HIGH (ref <130)
Total CHOL/HDL Ratio: 5.2 Ratio — ABNORMAL HIGH (ref <=5.0)
Triglycerides: 143 mg/dL (ref <150)
VLDL: 29 mg/dL (ref <30)

## 2016-05-18 NOTE — Patient Instructions (Addendum)
Smoking Cessation, Tips for Success If you are ready to quit smoking, congratulations! You have chosen to help yourself be healthier. Cigarettes bring nicotine, tar, carbon monoxide, and other irritants into your body. Your lungs, heart, and blood vessels will be able to work better without these poisons. There are many different ways to quit smoking. Nicotine gum, nicotine patches, a nicotine inhaler, or nicotine nasal spray can help with physical craving. Hypnosis, support groups, and medicines help break the habit of smoking. WHAT THINGS CAN I DO TO MAKE QUITTING EASIER?  Here are some tips to help you quit for good:  Pick a date when you will quit smoking completely. Tell all of your friends and family about your plan to quit on that date.  Do not try to slowly cut down on the number of cigarettes you are smoking. Pick a quit date and quit smoking completely starting on that day.  Throw away all cigarettes.   Clean and remove all ashtrays from your home, work, and car.  On a card, write down your reasons for quitting. Carry the card with you and read it when you get the urge to smoke.  Cleanse your body of nicotine. Drink enough water and fluids to keep your urine clear or pale yellow. Do this after quitting to flush the nicotine from your body.  Learn to predict your moods. Do not let a bad situation be your excuse to have a cigarette. Some situations in your life might tempt you into wanting a cigarette.  Never have "just one" cigarette. It leads to wanting another and another. Remind yourself of your decision to quit.  Change habits associated with smoking. If you smoked while driving or when feeling stressed, try other activities to replace smoking. Stand up when drinking your coffee. Brush your teeth after eating. Sit in a different chair when you read the paper. Avoid alcohol while trying to quit, and try to drink fewer caffeinated beverages. Alcohol and caffeine may urge you to  smoke.  Avoid foods and drinks that can trigger a desire to smoke, such as sugary or spicy foods and alcohol.  Ask people who smoke not to smoke around you.  Have something planned to do right after eating or having a cup of coffee. For example, plan to take a walk or exercise.  Try a relaxation exercise to calm you down and decrease your stress. Remember, you may be tense and nervous for the first 2 weeks after you quit, but this will pass.  Find new activities to keep your hands busy. Play with a pen, coin, or rubber band. Doodle or draw things on paper.  Brush your teeth right after eating. This will help cut down on the craving for the taste of tobacco after meals. You can also try mouthwash.   Use oral substitutes in place of cigarettes. Try using lemon drops, carrots, cinnamon sticks, or chewing gum. Keep them handy so they are available when you have the urge to smoke.  When you have the urge to smoke, try deep breathing.  Designate your home as a nonsmoking area.  If you are a heavy smoker, ask your health care provider about a prescription for nicotine chewing gum. It can ease your withdrawal from nicotine.  Reward yourself. Set aside the cigarette money you save and buy yourself something nice.  Look for support from others. Join a support group or smoking cessation program. Ask someone at home or at work to help you with your plan   to quit smoking.  Always ask yourself, "Do I need this cigarette or is this just a reflex?" Tell yourself, "Today, I choose not to smoke," or "I do not want to smoke." You are reminding yourself of your decision to quit.  Do not replace cigarette smoking with electronic cigarettes (commonly called e-cigarettes). The safety of e-cigarettes is unknown, and some may contain harmful chemicals.  If you relapse, do not give up! Plan ahead and think about what you will do the next time you get the urge to smoke. HOW WILL I FEEL WHEN I QUIT SMOKING? You  may have symptoms of withdrawal because your body is used to nicotine (the addictive substance in cigarettes). You may crave cigarettes, be irritable, feel very hungry, cough often, get headaches, or have difficulty concentrating. The withdrawal symptoms are only temporary. They are strongest when you first quit but will go away within 10-14 days. When withdrawal symptoms occur, stay in control. Think about your reasons for quitting. Remind yourself that these are signs that your body is healing and getting used to being without cigarettes. Remember that withdrawal symptoms are easier to treat than the major diseases that smoking can cause.  Even after the withdrawal is over, expect periodic urges to smoke. However, these cravings are generally short lived and will go away whether you smoke or not. Do not smoke! WHAT RESOURCES ARE AVAILABLE TO HELP ME QUIT SMOKING? Your health care provider can direct you to community resources or hospitals for support, which may include:  Group support.  Education.  Hypnosis.  Therapy.   This information is not intended to replace advice given to you by your health care provider. Make sure you discuss any questions you have with your health care provider.   Document Released: 07/28/2004 Document Revised: 11/20/2014 Document Reviewed: 04/17/2013 Elsevier Interactive Patient Education 2016 [employer]. About Constipation  Constipation Overview Constipation is the most common gastrointestinal complaint - about 4 million Americans experience constipation and make 2.5 million physician visits a year to get help for the problem.  Constipation can occur when the colon absorbs too much water, the colon's muscle contraction is slow or sluggish, and/or there is delayed transit time through the colon.  The result is stool that is hard and dry.  Indicators of constipation include straining during bowel movements greater than 25% of the time, having fewer than three bowel  movements per week, and/or the feeling of incomplete evacuation.  There are established guidelines ([place] II ) for defining constipation. A person needs to have two or more of the following symptoms for at least 12 weeks (not necessarily consecutive) in the preceding 12 months: . Straining in  greater than 25% of bowel movements . Lumpy or hard stools in greater than 25% of bowel movements . Sensation of incomplete emptying in greater than 25% of bowel movements . Sensation of anorectal obstruction/blockade in greater than 25% of bowel movements . Manual maneuvers to help empty greater than 25% of bowel movements (e.g., digital evacuation, support of the pelvic floor)  . Less than  3 bowel movements/week . Loose stools are not present, and criteria for irritable bowel syndrome are insufficient  Common Causes of Constipation . Lack of fiber in your diet . Lack of physical activity . Medications, including iron and calcium supplements  . Dairy intake . Dehydration . Abuse of laxatives  Travel  Irritable Bowel Syndrome  Pregnancy  Luteal phase of menstruation (after ovulation and before menses)  Colorectal problems  Intestinal  Dysfunction  Treating Constipation  There are several ways of treating constipation, including changes to diet and exercise, use of laxatives, adjustments to the pelvic floor, and scheduled toileting.  These treatments include: . increasing fiber and fluids in the diet  . increasing physical activity . learning muscle coordination   learning proper toileting techniques and toileting modifications   designing and sticking  to a toileting schedule     2007, [employer] Doc.22Hormone Therapy At menopause, your body begins making less estrogen and progesterone hormones. This causes the body to stop having menstrual periods. This is because estrogen and progesterone hormones control your periods and menstrual cycle. A lack of estrogen may cause  symptoms such as:  Hot flushes (or hot flashes).  Vaginal dryness.  Dry skin.  Loss of sex drive.  Risk of bone loss (osteoporosis). When this happens, you may choose to take hormone therapy to get back the estrogen lost during menopause. When the hormone estrogen is given alone, it is usually referred to as ET (Estrogen Therapy). When the hormone progestin is combined with estrogen, it is generally called HT (Hormone Therapy). This was formerly known as hormone replacement therapy (HRT). Your caregiver can help you make a decision on what will be best for you. The decision to use HT seems to change often as new studies are done. Many studies do not agree on the benefits of hormone replacement therapy. LIKELY BENEFITS OF HT INCLUDE PROTECTION FROM:  Hot Flushes (also called hot flashes) - A hot flush is a sudden feeling of heat that spreads over the face and body. The skin may redden like a blush. It is connected with sweats and sleep disturbance. Women going through menopause may have hot flushes a few times a month or several times per day depending on the woman.  Osteoporosis (bone loss) - Estrogen helps guard against bone loss. After menopause, a woman's bones slowly lose calcium and become weak and brittle. As a result, bones are more likely to break. The hip, wrist, and spine are affected most often. Hormone therapy can help slow bone loss after menopause. Weight bearing exercise and taking calcium with vitamin D also can help prevent bone loss. There are also medications that your caregiver can prescribe that can help prevent osteoporosis.  Vaginal dryness - Loss of estrogen causes changes in the vagina. Its lining may become thin and dry. These changes can cause pain and bleeding during sexual intercourse. Dryness can also lead to infections. This can cause burning and itching. (Vaginal estrogen treatment can help relieve pain, itching, and dryness.)  Urinary tract infections are more  common after menopause because of lack of estrogen. Some women also develop urinary incontinence because of low estrogen levels in the vagina and bladder.  Possible other benefits of estrogen include a positive effect on mood and short-term memory in women. RISKS AND COMPLICATIONS  Using estrogen alone without progesterone causes the lining of the uterus to grow. This increases the risk of lining of the uterus (endometrial) cancer. Your caregiver should give another hormone called progestin if you have a uterus.  Women who take combined (estrogen and progestin) HT appear to have an increased risk of breast cancer. The risk appears to be small, but increases throughout the time that HT is taken.  Combined therapy also makes the breast tissue slightly denser which makes it harder to read mammograms (breast X-rays).  Combined, estrogen and progesterone therapy can be taken together every day, in which case there may  be spotting of blood. HT therapy can be taken cyclically in which case you will have menstrual periods. Cyclically means HT is taken for a set amount of days, then not taken, then this process is repeated.  HT may increase the risk of stroke, heart attack, breast cancer and forming blood clots in your leg.  Transdermal estrogen (estrogen that is absorbed through the skin with a patch or a cream) may have better results with:  Cholesterol.  Blood pressure.  Blood clots. Having the following conditions may indicate you should not have HT:  Endometrial cancer.  Liver disease.  Breast cancer.  Heart disease.  History of blood clots.  Stroke. TREATMENT   If you choose to take HT and have a uterus, usually estrogen and progestin are prescribed.  Your caregiver will help you decide the best way to take the medications.  Possible ways to take estrogen include:  Pills.  Patches.  Gels.  Sprays.  Vaginal estrogen cream, rings and tablets.  It is best to take the  lowest dose possible that will help your symptoms and take them for the shortest period of time that you can.  Hormone therapy can help relieve some of the problems (symptoms) that affect women at menopause. Before making a decision about HT, talk to your caregiver about what is best for you. Be well informed and comfortable with your decisions. HOME CARE INSTRUCTIONS   Follow your caregivers advice when taking the medications.  A Pap test is done to screen for cervical cancer.  The first Pap test should be done at age 30.  Between ages 48 and 82, Pap tests are repeated every 2 years.  Beginning at age 42, you are advised to have a Pap test every 3 years as long as the past 3 Pap tests have been normal.  Some women have medical problems that increase the chance of getting cervical cancer. Talk to your caregiver about these problems. It is especially important to talk to your caregiver if a new problem develops soon after your last Pap test. In these cases, your caregiver may recommend more frequent screening and Pap tests.  The above recommendations are the same for women who have or have not gotten the vaccine for HPV (human papillomavirus).  If you had a hysterectomy for a problem that was not a cancer or a condition that could lead to cancer, then you no longer need Pap tests. However, even if you no longer need a Pap test, a regular exam is a good idea to make sure no other problems are starting.  If you are between ages 86 and 71, and you have had normal Pap tests going back 10 years, you no longer need Pap tests. However, even if you no longer need a Pap test, a regular exam is a good idea to make sure no other problems are starting.  If you have had past treatment for cervical cancer or a condition that could lead to cancer, you need Pap tests and screening for cancer for at least 20 years after your treatment.  If Pap tests have been discontinued, risk factors (such as a new sexual  partner)need to be re-assessed to determine if screening should be resumed.  Some women may need screenings more often if they are at high risk for cervical cancer.  Get mammograms done as per the advice of your caregiver. SEEK IMMEDIATE MEDICAL CARE IF:  You develop abnormal vaginal bleeding.  You have pain or swelling in your  legs, shortness of breath, or chest pain.  You develop dizziness or headaches.  You have lumps or changes in your breasts or armpits.  You have slurred speech.  You develop weakness or numbness of your arms or legs.  You have pain, burning, or bleeding when urinating.  You develop abdominal pain.   This information is not intended to replace advice given to you by your health care provider. Make sure you discuss any questions you have with your health care provider.   Document Released: 07/29/2003 Document Revised: 03/16/2015 Document Reviewed: 05/03/2015 Elsevier Interactive Patient Education 2016 Elsevier Inc. Menopause and Herbal Products WHAT IS MENOPAUSE? Menopause is the normal time of life when menstrual periods decrease in frequency and eventually stop completely. This process can take several years for some women. Menopause is complete when you have had an absence of menstruation for a full year since your last menstrual period. It usually occurs between the ages of 12 and 6. It is not common for menopause to begin before the age of 77. During menopause, your body stops producing the female hormones estrogen and progesterone. Common symptoms associated with this loss of hormones (vasomotor symptoms) are:  Hot flashes.  Hot flushes.  Night sweats. Other common symptoms and complications of menopause include:  Decrease in sex drive.  Vaginal dryness and thinning of the walls of the vagina. This can make sex painful.  Dryness of the skin and development of wrinkles.  Headaches.  Tiredness.  Irritability.  Memory problems.  Weight  gain.  Bladder infections.  Hair growth on the face and chest.  Inability to reproduce offspring (infertility).  Loss of density in the bones (osteoporosis) increasing your risk for breaks (fractures).  Depression.  Hardening and narrowing of the arteries (atherosclerosis). This increases your risk of heart attack and stroke. WHAT TREATMENT OPTIONS ARE AVAILABLE? There are many treatment choices for menopause symptoms. The most common treatment is hormone replacement therapy. Many alternative therapies for menopause are emerging, including the use of herbal products. These supplements can be found in the form of herbs, teas, oils, tinctures, and pills. Common herbal supplements for menopause are made from plants that contain phytoestrogens. Phytoestrogens are compounds that occur naturally in plants and plant products. They act like estrogen in the body. Foods and herbs that contain phytoestrogens include:  Soy.  Flax seeds.  Red clover.  Ginseng. WHAT MENOPAUSE SYMPTOMS MAY BE HELPED IF I USE HERBAL PRODUCTS?  Vasomotor symptoms. These may be helped by:  Soy. Some studies show that soy may have a moderate benefit for hot flashes.  Black cohosh. There is limited evidence indicating this may be beneficial for hot flashes.  Symptoms that are related to heart and blood vessel disease. These may be helped by soy. Studies have shown that soy can help to lower cholesterol.  Depression. This may be helped by:  St. John's wort. There is limited evidence that shows this may help mild to moderate depression.  Black cohosh. There is evidence that this may help depression and mood swings.  Osteoporosis. Soy may help to decrease bone loss that is associated with menopause and may prevent osteoporosis. Limited evidence indicates that red clover may offer some bone loss protection as well. Other herbal products that are commonly used during menopause lack enough evidence to support their  use as a replacement for conventional menopause therapies. These products include evening primrose, ginseng, and red clover. WHAT ARE THE CASES WHEN HERBAL PRODUCTS SHOULD NOT BE USED DURING MENOPAUSE?  Do not use herbal products during menopause without your health care provider's approval if:  You are taking medicine.  You have a preexisting liver condition. ARE THERE ANY RISKS IN MY TAKING HERBAL PRODUCTS DURING MENOPAUSE? If you choose to use herbal products to help with symptoms of menopause, keep in mind that:  Different supplements have different and unmeasured amounts of herbal ingredients.  Herbal products are not regulated the same way that medicines are.  Concentrations of herbs may vary depending on the way they are prepared. For example, the concentration may be different in a pill, tea, oil, and tincture.  Little is known about the risks of using herbal products, particularly the risks of long-term use.  Some herbal supplements can be harmful when combined with certain medicines. Most commonly reported side effects of herbal products are mild. However, if used improperly, many herbal supplements can cause serious problems. Talk to your health care provider before starting any herbal product. If problems develop, stop taking the supplement and let your health care provider know.   This information is not intended to replace advice given to you by your health care provider. Make sure you discuss any questions you have with your health care provider.   Document Released: 04/17/2008 Document Revised: 11/20/2014 Document Reviewed: 04/14/2014 Elsevier Interactive Patient Education [employer].

## 2016-05-18 NOTE — Progress Notes (Signed)
Patient ID: Kathryn Church, female   DOB: 1966-10-26, 50 y.o.   MRN: [id number] 50 y.o. G3P3 SingleCaucasianF here for annual exam. Patient has noticed blood on the toilet paper  when she wipes after having a BM. She has noticed it twice in the last 3 days after BM. The blood isn't in the toilet, just when she wipes, small amount. She is slightly constipated and strains some. BM any where from every day to once a week.  H/O TLH, no vaginal bleeding. Not sexually active.  She has been smoking a pack a day since she was 14.  She c/o hotflashe's for the last 3 years, worse at night. Night sweats at night. She can be up 3-4 x a night with symptoms.     Patient's last menstrual period was 02/16/2009.          Sexually active: No.  The current method of family planning is status post hysterectomy.    Exercising: No.  The patient does not participate in regular exercise at present. Smoker:  yes  Health Maintenance: Pap:  12-31-08 WNL- Hysterectomy  History of abnormal Pap:  no MMG:  09-24-15 WNL Colonoscopy:  Never BMD:   Never TDaP:  04-08-13 Gardasil: N/A   reports that she has been smoking Cigarettes.  She has been smoking about 1.00 pack per day. She has never used smokeless tobacco. She reports that she does not drink alcohol or use illicit drugs. She is making medical supplies. 3 kids, 29,30 and 34. She has 6 grandchildren. Everyone is local.   Past Medical History  Diagnosis Date  . Fibroid   . Depression     Past Surgical History  Procedure Laterality Date  . Laparoscopic total hysterectomy  02/16/09    Robotic assisted  . Novasure ablation    . Tubal ligation    . Breast biopsy      benign    Current Outpatient Prescriptions  Medication Sig Dispense Refill  . DULoxetine (CYMBALTA) 30 MG capsule Take 1 capsule (30 mg total) by mouth daily. 90 capsule 4   No current facility-administered medications for this visit.    Family History  Problem Relation Age of Onset  . Rheum  arthritis Mother   . Hypertension Mother   . Alcoholism Father   . Stroke Maternal Grandmother     mini strokes  . Heart disease Maternal Grandfather     pacemaker  . Other Brother     vasculitis disease-unsure of all diag  . Diabetes Maternal Grandfather   . Heart disease Paternal Grandfather     Review of Systems  Constitutional: Negative.   HENT: Negative.   Eyes: Negative.   Respiratory: Negative.   Cardiovascular: Negative.   Gastrointestinal: Positive for constipation and anal bleeding.  Endocrine: Negative.   Genitourinary: Negative.   Musculoskeletal: Negative.   Skin: Negative.   Allergic/Immunologic: Negative.   Neurological: Negative.   Psychiatric/Behavioral: Negative.     Exam:   BP 122/80 mmHg  Pulse 84  Resp 16  Ht 5\' 6"  (1.676 m)  Wt 147 lb (66.679 kg)  BMI 23.74 kg/m2  LMP 02/16/2009  Weight change: @WEIGHTCHANGE @ Height:   Height: 5\' 6"  (167.6 cm)  Ht Readings from Last 3 Encounters:  05/18/16 5\' 6"  (1.676 m)  05/11/15 5' 6.25" (1.683 m)  05/07/14 5' 6.5" (1.689 m)    General appearance: alert, cooperative and appears stated age Head: Normocephalic, without obvious abnormality, atraumatic Neck: no adenopathy, supple, symmetrical, trachea midline and thyroid  normal to inspection and palpation Lungs: clear to auscultation bilaterally Breasts: normal appearance, no masses or tenderness Heart: regular rate and rhythm Abdomen: soft, non-tender; bowel sounds normal; no masses,  no organomegaly Extremities: extremities normal, atraumatic, no cyanosis or edema Skin: Skin color, texture, turgor normal. No rashes or lesions Lymph nodes: Cervical, supraclavicular, and axillary nodes normal. No abnormal inguinal nodes palpated Neurologic: Grossly normal   Pelvic: External genitalia:  no lesions              Urethra:  normal appearing urethra with no masses, tenderness or lesions              Bartholins and Skenes: normal                 Vagina: normal  appearing vagina with normal color and discharge, no lesions              Cervix: absent               Bimanual Exam:  Uterus:  uterus absent              Adnexa: no mass, fullness, tenderness               Rectovaginal: Confirms               Anus:  normal sphincter tone, possible fissure at 12 o'clock, not clear  Chaperone was present for exam.  A:  Well Woman with normal exam  Bleeding with BM, suspect a fissure, not well seen  Constipation  Vasomotor symptoms  P:   Discussed treatment of vasomotor symptoms, she will try OTC treatment   Discussed ERT and risks and gabapentin  Information on smoking cessation given, will refer to primary to discuss smoking cessation medication  Screening labs   No pap needed  Discussed increasing her intake of fruit, fiber and fluids for constipation, also discussed use of miralax or metamucil  Will refer to GI for evaluation of rectal bleeding and colonoscopy

## 2016-05-19 LAB — VITAMIN D 25 HYDROXY (VIT D DEFICIENCY, FRACTURES): VIT D 25 HYDROXY: 40 ng/mL (ref 30–100)

## 2016-05-22 ENCOUNTER — Other Ambulatory Visit: Payer: Self-pay | Admitting: *Deleted

## 2016-05-22 MED ORDER — DULOXETINE HCL 30 MG PO CPEP: 30.0000 mg | ORAL_CAPSULE | Freq: Every day | ORAL | Status: DC

## 2016-05-22 NOTE — Telephone Encounter (Signed)
Left message to call regarding lab results -eh 

## 2016-05-22 NOTE — Telephone Encounter (Signed)
Left voicemail for pt. Rx sent to pharmacy.

## 2016-05-22 NOTE — Telephone Encounter (Signed)
Patient notified of results. She will call us when she once she establish care with a primary care to send results.  Patient requesting cymbalta refill.   Medication refill request: Cymbalta  Last AEX:  05/18/16 JJ Next AEX: 06/07/17 JJ Last MMG (if hormonal medication request): 09/24/15 BIRADS1:Neg Refill authorized: 05/11/15 #[id number]. Today please advise.

## 2016-05-22 NOTE — Telephone Encounter (Signed)
Script sent for 1 year

## 2016-05-22 NOTE — Telephone Encounter (Signed)
-----   Message from Salvadore Dom, MD sent at 05/21/2016 12:47 PM EDT ----- Please inform the patient that her lipid panel is abnormal and double check if she was fasting at the time. If not she should return for a fasting lipid panel. She should eat a diet low in saturated fats and exercise a minimum of 3 days a week. She should f/u with her primary. Please send a copy of her labs to her primary. The rest of her blood work is normal.

## 2016-05-24 ENCOUNTER — Other Ambulatory Visit: Payer: Self-pay | Admitting: Obstetrics & Gynecology

## 2016-05-25 NOTE — Telephone Encounter (Signed)
Medication refill request: DULoxetine 30mg   Last AEX:  05/18/16 JJ Next AEX: 06/07/17  Last MMG (if hormonal medication request): 09/24/15 BIRADS1 negative Refill authorized: 05/22/16 already refilled

## 2016-05-30 ENCOUNTER — Encounter: Payer: Self-pay | Admitting: Gastroenterology

## 2016-06-30 ENCOUNTER — Ambulatory Visit: Payer: Self-pay | Admitting: Gastroenterology

## 2016-08-10 ENCOUNTER — Ambulatory Visit: Payer: BLUE CROSS/BLUE SHIELD | Admitting: Obstetrics & Gynecology

## 2016-10-17 DIAGNOSIS — B359 Dermatophytosis, unspecified: Secondary | ICD-10-CM | POA: Diagnosis not present

## 2016-10-17 DIAGNOSIS — L570 Actinic keratosis: Secondary | ICD-10-CM | POA: Diagnosis not present

## 2016-10-26 DIAGNOSIS — F338 Other recurrent depressive disorders: Secondary | ICD-10-CM | POA: Diagnosis not present

## 2016-10-26 DIAGNOSIS — M545 Low back pain: Secondary | ICD-10-CM | POA: Diagnosis not present

## 2016-10-26 DIAGNOSIS — Z131 Encounter for screening for diabetes mellitus: Secondary | ICD-10-CM | POA: Diagnosis not present

## 2016-10-26 DIAGNOSIS — Z79899 Other long term (current) drug therapy: Secondary | ICD-10-CM | POA: Diagnosis not present

## 2016-12-28 ENCOUNTER — Telehealth: Payer: Self-pay | Admitting: Obstetrics and Gynecology

## 2016-12-28 NOTE — Telephone Encounter (Signed)
I got a letter from the patient's pharmacy stating that she didn't refill her Cymbalta. Make sure she knows that if she stops Cymbalta, she is supposed to do it over 2-4 weeks. Thanks.

## 2016-12-29 NOTE — Telephone Encounter (Signed)
Left message to call Kaitlyn at [phone]. 

## 2017-01-03 DIAGNOSIS — B359 Dermatophytosis, unspecified: Secondary | ICD-10-CM | POA: Diagnosis not present

## 2017-01-09 DIAGNOSIS — Z6822 Body mass index (BMI) 22.0-22.9, adult: Secondary | ICD-10-CM | POA: Diagnosis not present

## 2017-01-09 DIAGNOSIS — M1612 Unilateral primary osteoarthritis, left hip: Secondary | ICD-10-CM | POA: Diagnosis not present

## 2017-01-09 DIAGNOSIS — M160 Bilateral primary osteoarthritis of hip: Secondary | ICD-10-CM | POA: Diagnosis not present

## 2017-01-09 NOTE — Telephone Encounter (Signed)
Left message to call Kaitlyn at [phone]. 

## 2017-01-11 NOTE — Telephone Encounter (Signed)
Dr.Jertson, I have not been able to reach the patient x2. Please advise.

## 2017-01-15 NOTE — Telephone Encounter (Signed)
Will close encounter

## 2017-06-07 ENCOUNTER — Encounter: Payer: Self-pay | Admitting: Obstetrics and Gynecology

## 2017-06-07 ENCOUNTER — Ambulatory Visit (INDEPENDENT_AMBULATORY_CARE_PROVIDER_SITE_OTHER): Payer: BLUE CROSS/BLUE SHIELD | Admitting: Obstetrics and Gynecology

## 2017-06-07 VITALS — BP 120/70 | HR 66 | Resp 18 | Ht 66.25 in | Wt 136.0 lb

## 2017-06-07 DIAGNOSIS — Z1211 Encounter for screening for malignant neoplasm of colon: Secondary | ICD-10-CM

## 2017-06-07 DIAGNOSIS — Z01419 Encounter for gynecological examination (general) (routine) without abnormal findings: Secondary | ICD-10-CM

## 2017-06-07 DIAGNOSIS — Z113 Encounter for screening for infections with a predominantly sexual mode of transmission: Secondary | ICD-10-CM | POA: Diagnosis not present

## 2017-06-07 DIAGNOSIS — Z0000 Encounter for general adult medical examination without abnormal findings: Secondary | ICD-10-CM | POA: Diagnosis not present

## 2017-06-07 DIAGNOSIS — N6320 Unspecified lump in the left breast, unspecified quadrant: Secondary | ICD-10-CM | POA: Diagnosis not present

## 2017-06-07 NOTE — Progress Notes (Signed)
Scheduled patient for bilateral diagnostic mammogram with left breast ultrasound at [facility] in [place] on 06/13/17 at 3 pm. Patient is agreeable to date and time. Placed in mammogram hold.

## 2017-06-07 NOTE — Patient Instructions (Addendum)
EXERCISE AND DIET:  We recommended that you start or continue a regular exercise program for good health. Regular exercise means any activity that makes your heart beat faster and makes you sweat.  We recommend exercising at least 30 minutes per day at least 3 days a week, preferably 4 or 5.  We also recommend a diet low in fat and sugar.  Inactivity, poor dietary choices and obesity can cause diabetes, heart attack, stroke, and kidney damage, among others.    ALCOHOL AND SMOKING:  Women should limit their alcohol intake to no more than 7 drinks/beers/glasses of wine (combined, not each!) per week. Moderation of alcohol intake to this level decreases your risk of breast cancer and liver damage. And of course, no recreational drugs are part of a healthy lifestyle.  And absolutely no smoking or even second hand smoke. Most people know smoking can cause heart and lung diseases, but did you know it also contributes to weakening of your bones? Aging of your skin?  Yellowing of your teeth and nails?  CALCIUM AND VITAMIN D:  Adequate intake of calcium and Vitamin D are recommended.  The recommendations for exact amounts of these supplements seem to change often, but generally speaking 600 mg of calcium (either carbonate or citrate) and 800 units of Vitamin D per day seems prudent. Certain women may benefit from higher intake of Vitamin D.  If you are among these women, your doctor will have told you during your visit.    ++ you should be getting 1,200 mg a day of calcium and 800 IU of vit D  PAP SMEARS:  Pap smears, to check for cervical cancer or precancers,  have traditionally been done yearly, although recent scientific advances have shown that most women can have pap smears less often.  However, every woman still should have a physical exam from her gynecologist every year. It will include a breast check, inspection of the vulva and vagina to check for abnormal growths or skin changes, a visual exam of the  cervix, and then an exam to evaluate the size and shape of the uterus and ovaries.  And after 53 years of age, a rectal exam is indicated to check for rectal cancers. We will also provide age appropriate advice regarding health maintenance, like when you should have certain vaccines, screening for sexually transmitted diseases, bone density testing, colonoscopy, mammograms, etc.   MAMMOGRAMS:  All women over 37 years old should have a yearly mammogram. Many facilities now offer a "3D" mammogram, which may cost around $50 extra out of pocket. If possible,  we recommend you accept the option to have the 3D mammogram performed.  It both reduces the number of women who will be called back for extra views which then turn out to be normal, and it is better than the routine mammogram at detecting truly abnormal areas.    COLONOSCOPY:  Colonoscopy to screen for colon cancer is recommended for all women at age 61.  We know, you hate the idea of the prep.  We agree, BUT, having colon cancer and not knowing it is worse!!  Colon cancer so often starts as a polyp that can be seen and removed at colonscopy, which can quite literally save your life!  And if your first colonoscopy is normal and you have no family history of colon cancer, most women don't have to have it again for 10 years.  Once every ten years, you can do something that may end up saving  your life, right?  We will be happy to help you get it scheduled when you are ready.  Be sure to check your insurance coverage so you understand how much it will cost.  It may be covered as a preventative service at no cost, but you should check your particular policy.      Breast Self-Awareness Breast self-awareness means being familiar with how your breasts look and feel. It involves checking your breasts regularly and reporting any changes to your health care provider. Practicing breast self-awareness is important. A change in your breasts can be a sign of a serious  medical problem. Being familiar with how your breasts look and feel allows you to find any problems early, when treatment is more likely to be successful. All women should practice breast self-awareness, including women who have had breast implants. How to do a breast self-exam One way to learn what is normal for your breasts and whether your breasts are changing is to do a breast self-exam. To do a breast self-exam: Look for Changes  1. Remove all the clothing above your waist. 2. Stand in front of a mirror in a room with good lighting. 3. Put your hands on your hips. 4. Push your hands firmly downward. 5. Compare your breasts in the mirror. Look for differences between them (asymmetry), such as: ? Differences in shape. ? Differences in size. ? Puckers, dips, and bumps in one breast and not the other. 6. Look at each breast for changes in your skin, such as: ? Redness. ? Scaly areas. 7. Look for changes in your nipples, such as: ? Discharge. ? Bleeding. ? Dimpling. ? Redness. ? A change in position. Feel for Changes  Carefully feel your breasts for lumps and changes. It is best to do this while lying on your back on the floor and again while sitting or standing in the shower or tub with soapy water on your skin. Feel each breast in the following way:  Place the arm on the side of the breast you are examining above your head.  Feel your breast with the other hand.  Start in the nipple area and make  inch (2 cm) overlapping circles to feel your breast. Use the pads of your three middle fingers to do this. Apply light pressure, then medium pressure, then firm pressure. The light pressure will allow you to feel the tissue closest to the skin. The medium pressure will allow you to feel the tissue that is a little deeper. The firm pressure will allow you to feel the tissue close to the ribs.  Continue the overlapping circles, moving downward over the breast until you feel your ribs below  your breast.  Move one finger-width toward the center of the body. Continue to use the  inch (2 cm) overlapping circles to feel your breast as you move slowly up toward your collarbone.  Continue the up and down exam using all three pressures until you reach your armpit.  Write Down What You Find  Write down what is normal for each breast and any changes that you find. Keep a written record with breast changes or normal findings for each breast. By writing this information down, you do not need to depend only on memory for size, tenderness, or location. Write down where you are in your menstrual cycle, if you are still menstruating. If you are having trouble noticing differences in your breasts, do not get discouraged. With time you will become more familiar with  the variations in your breasts and more comfortable with the exam. How often should I examine my breasts? Examine your breasts every month. If you are breastfeeding, the best time to examine your breasts is after a feeding or after using a breast pump. If you menstruate, the best time to examine your breasts is 5-7 days after your period is over. During your period, your breasts are lumpier, and it may be more difficult to notice changes. When should I see my health care provider? See your health care provider if you notice:  A change in shape or size of your breasts or nipples.  A change in the skin of your breast or nipples, such as a reddened or scaly area.  Unusual discharge from your nipples.  A lump or thick area that was not there before.  Pain in your breasts.  Anything that concerns you.  This information is not intended to replace advice given to you by your health care provider. Make sure you discuss any questions you have with your health care provider. Document Released: 10/30/2005 Document Revised: 04/06/2016 Document Reviewed: 09/19/2015 Elsevier Interactive Patient Education  [employer].

## 2017-06-07 NOTE — Progress Notes (Signed)
51 y.o. G3P3 SingleCaucasianF here for annual exam.  H/O TLH in 2010. No vaginal bleeding. Sexually active, no pain. She has a new partner for the last 4 months. She is having tolerable vasomotor symptoms. No vaginal dryness.  Went off the Cymbalta and is doing fine.     Patient's last menstrual period was 02/16/2009.          Sexually active: Yes.    The current method of family planning is status post hysterectomy.    Exercising: No.  The patient does not participate in regular exercise at present. Smoker: yes  Health Maintenance: Pap:  12/31/08 Normal  History of abnormal Pap:  no MMG:  09/24/15 BIRADS1:neg  Colonoscopy:  Never BMD:   Never  TDaP:  03/2013   reports that she has been smoking Cigarettes.  She has been smoking about 1.00 pack per day. She has never used smokeless tobacco. She reports that she does not drink alcohol or use drugs. 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 Kids are 30, 31 and 35. She has 6 grandchildren. All local.   Past Medical History:  Diagnosis Date  . Depression   . Fibroid     Past Surgical History:  Procedure Laterality Date  . BREAST BIOPSY     benign  . LAPAROSCOPIC TOTAL HYSTERECTOMY  02/16/09   Robotic assisted  . NOVASURE ABLATION    . TUBAL LIGATION      Current Outpatient Prescriptions  Medication Sig Dispense Refill  . Multiple Vitamin (MULTIVITAMIN) tablet Take 1 tablet by mouth daily.     No current facility-administered medications for this visit.     Family History  Problem Relation Age of Onset  . Rheum arthritis Mother   . Hypertension Mother   . Alcoholism Father   . Stroke Maternal Grandmother        mini strokes  . Heart disease Maternal Grandfather        pacemaker  . Diabetes Maternal Grandfather   . Other Brother        vasculitis disease-unsure of all diag  . Heart disease Paternal Grandfather     Review of Systems  Constitutional: Negative.   HENT: Negative.   Eyes: Negative.   Respiratory: Negative.    Cardiovascular: Negative.   Gastrointestinal: Negative.   Endocrine: Negative.   Genitourinary: Negative.   Musculoskeletal: Positive for arthralgias and myalgias.  Skin: Negative.   Allergic/Immunologic: Negative.   Neurological: Negative.   Hematological: Negative.   Psychiatric/Behavioral: Negative.     Exam:   BP 120/70 (BP Location: Right Arm, Patient Position: Sitting, Cuff Size: Normal)   Pulse 66   Resp 18   Ht 5' 6.25" (1.683 m)   Wt 136 lb (61.7 kg)   LMP 02/16/2009   BMI 21.79 kg/m   Weight change: @WEIGHTCHANGE @ Height:   Height: 5' 6.25" (168.3 cm)  Ht Readings from Last 3 Encounters:  06/07/17 5' 6.25" (1.683 m)  05/18/16 5\' 6"  (1.676 m)  05/11/15 5' 6.25" (1.683 m)    General appearance: alert, cooperative and appears stated age Head: Normocephalic, without obvious abnormality, atraumatic Neck: no adenopathy, supple, symmetrical, trachea midline and thyroid normal to inspection and palpation Lungs: clear to auscultation bilaterally Cardiovascular: regular rate and rhythm Breasts: bilateral fibrocystic changes, more prominent in the left breast, nodure in the left breast at 10 o'clock a few cm from the areolar region, bean shaped, unter 1 cm. Also more noularity in the left breast at 1 o'clock.  Abdomen: soft, non-tender; bowel sounds  normal; no masses,  no organomegaly Extremities: extremities normal, atraumatic, no cyanosis or edema Skin: Skin color, texture, turgor normal. No rashes or lesions Lymph nodes: Cervical, supraclavicular, and axillary nodes normal. No abnormal inguinal nodes palpated Neurologic: Grossly normal   Pelvic: External genitalia:  no lesions              Urethra:  normal appearing urethra with no masses, tenderness or lesions              Bartholins and Skenes: normal                 Vagina: normal appearing vagina with normal color and discharge, no lesions              Cervix: absent               Bimanual Exam:  Uterus:  uterus  absent              Adnexa: no mass, fullness, tenderness               Rectovaginal: Confirms               Anus:  normal sphincter tone, no lesions  Chaperone was present for exam.  A:  Well Woman with normal exam  Bilaterally fibrocystic breast changes, nodule felt in the left breast at 10 o'clock (a few cm from the areolar region), more nodular around 1 o'clock as well  P:   No pap  Discussed breast self exam  Discussed calcium and vit D intake  Will set up diagnostic breast imaging  F/U breast check in 6 weeks  Colonoscopy, we will set up  STD screening  Screening labs

## 2017-06-08 LAB — COMPREHENSIVE METABOLIC PANEL
A/G RATIO: 1.7 (ref 1.2–2.2)
ALT: 11 IU/L (ref 0–32)
AST: 16 IU/L (ref 0–40)
Albumin: 4.3 g/dL (ref 3.5–5.5)
Alkaline Phosphatase: 87 IU/L (ref 39–117)
BILIRUBIN TOTAL: 0.2 mg/dL (ref 0.0–1.2)
BUN/Creatinine Ratio: 18 (ref 9–23)
BUN: 14 mg/dL (ref 6–24)
CHLORIDE: 107 mmol/L — AB (ref 96–106)
CO2: 22 mmol/L (ref 20–29)
Calcium: 10.5 mg/dL — ABNORMAL HIGH (ref 8.7–10.2)
Creatinine, Ser: 0.76 mg/dL (ref 0.57–1.00)
GFR calc Af Amer: 104 mL/min/{1.73_m2} (ref >59)
GFR calc non Af Amer: 90 mL/min/{1.73_m2} (ref >59)
GLUCOSE: 101 mg/dL — AB (ref 65–99)
Globulin, Total: 2.5 g/dL (ref 1.5–4.5)
POTASSIUM: 4.7 mmol/L (ref 3.5–5.2)
Sodium: 143 mmol/L (ref 134–144)
Total Protein: 6.8 g/dL (ref 6.0–8.5)

## 2017-06-08 LAB — GC/CHLAMYDIA PROBE AMP
Chlamydia trachomatis, NAA: NEGATIVE
NEISSERIA GONORRHOEAE BY PCR: NEGATIVE

## 2017-06-08 LAB — CBC
Hematocrit: 41.1 % (ref 34.0–46.6)
Hemoglobin: 13.4 g/dL (ref 11.1–15.9)
MCH: 31.8 pg (ref 26.6–33.0)
MCHC: 32.6 g/dL (ref 31.5–35.7)
MCV: 97 fL (ref 79–97)
PLATELETS: 303 10*3/uL (ref 150–379)
RBC: 4.22 x10E6/uL (ref 3.77–5.28)
RDW: 13.3 % (ref 12.3–15.4)
WBC: 5.1 10*3/uL (ref 3.4–10.8)

## 2017-06-08 LAB — LIPID PANEL
CHOLESTEROL TOTAL: 188 mg/dL (ref 100–199)
Chol/HDL Ratio: 4.3 ratio (ref 0.0–4.4)
HDL: 44 mg/dL (ref >39)
LDL Calculated: 128 mg/dL — ABNORMAL HIGH (ref 0–99)
TRIGLYCERIDES: 79 mg/dL (ref 0–149)
VLDL CHOLESTEROL CAL: 16 mg/dL (ref 5–40)

## 2017-06-08 LAB — HEPATITIS C ANTIBODY

## 2017-06-08 LAB — HEP, RPR, HIV PANEL
HEP B S AG: NEGATIVE
HIV SCREEN 4TH GENERATION: NONREACTIVE
RPR Ser Ql: NONREACTIVE

## 2017-06-13 ENCOUNTER — Telehealth: Payer: Self-pay | Admitting: Obstetrics and Gynecology

## 2017-06-13 DIAGNOSIS — N6320 Unspecified lump in the left breast, unspecified quadrant: Secondary | ICD-10-CM | POA: Diagnosis not present

## 2017-06-13 DIAGNOSIS — N6322 Unspecified lump in the left breast, upper inner quadrant: Secondary | ICD-10-CM | POA: Diagnosis not present

## 2017-06-13 DIAGNOSIS — R928 Other abnormal and inconclusive findings on diagnostic imaging of breast: Secondary | ICD-10-CM | POA: Diagnosis not present

## 2017-06-13 DIAGNOSIS — N6011 Diffuse cystic mastopathy of right breast: Secondary | ICD-10-CM | POA: Diagnosis not present

## 2017-06-13 DIAGNOSIS — N6012 Diffuse cystic mastopathy of left breast: Secondary | ICD-10-CM | POA: Diagnosis not present

## 2017-06-13 NOTE — Telephone Encounter (Signed)
Call to patient to review information regarding referral to Dr. Lorie Apley office. She is aware and agreeable to her appointment.   During call patient requested recent lab results. Patient agreeable to return call from nurse to review.  Routing to triage.

## 2017-06-13 NOTE — Telephone Encounter (Signed)
Dr. Talbert Nan, can you review labs dated 06/07/17 and advise? Thanks.

## 2017-06-14 NOTE — Telephone Encounter (Signed)
Spoke with patient and went over results in detail. Patient is already scheduled to see [place] next month so we will repeat her labs then. A copy of labs were sent to PCP. -eh

## 2017-06-14 NOTE — Telephone Encounter (Signed)
Left message for patient to call regarding lab results -eh  

## 2017-06-14 NOTE — Telephone Encounter (Signed)
Patient is returning a call to [place]. Patient is at work and will need to call you back later.

## 2017-06-14 NOTE — Telephone Encounter (Signed)
-----   Message from Salvadore Dom, MD sent at 06/13/2017  5:09 PM EDT ----- Please inform the patient that her calcium level is slightly elevated, she should get a repeat level with in the next month. Please order calcium and albumin levels. She shouldn't take any calcium supplements.  Her LDL (bad cholesterol) was mildly elevated, but better than last year. She should eat a healthy diet and exercise regularly.  The rest of her lab work was normal. Please send a copy to her primary MD

## 2017-06-15 ENCOUNTER — Telehealth: Payer: Self-pay | Admitting: Obstetrics and Gynecology

## 2017-06-15 NOTE — Telephone Encounter (Signed)
Spoke with patient & gave her all of her calcium results over the past few years. Pt states she is not taking any vitamins at all & we went over foods that are high in calcium & patient will pay attention to what foods she is eating. Pt has recheck appt scheduled & states she feels better now.

## 2017-06-15 NOTE — Telephone Encounter (Signed)
Routing to covering provider for final review. Will close encounter.   Cc: Dr. Talbert Nan

## 2017-06-15 NOTE — Telephone Encounter (Signed)
Patient has questions about her recent labs and her calcium results specifically. She seemed concerned and repeatedly requested to speak to a nurse right away.

## 2017-07-06 DIAGNOSIS — N3000 Acute cystitis without hematuria: Secondary | ICD-10-CM | POA: Diagnosis not present

## 2017-07-06 DIAGNOSIS — M1612 Unilateral primary osteoarthritis, left hip: Secondary | ICD-10-CM | POA: Diagnosis not present

## 2017-07-06 DIAGNOSIS — Z6820 Body mass index (BMI) 20.0-20.9, adult: Secondary | ICD-10-CM | POA: Diagnosis not present

## 2017-07-19 ENCOUNTER — Ambulatory Visit: Payer: BLUE CROSS/BLUE SHIELD | Admitting: Obstetrics and Gynecology

## 2017-07-23 ENCOUNTER — Ambulatory Visit (INDEPENDENT_AMBULATORY_CARE_PROVIDER_SITE_OTHER): Payer: BLUE CROSS/BLUE SHIELD | Admitting: Obstetrics and Gynecology

## 2017-07-23 ENCOUNTER — Encounter: Payer: Self-pay | Admitting: Obstetrics and Gynecology

## 2017-07-23 VITALS — BP 110/70 | HR 80 | Resp 16 | Wt 137.0 lb

## 2017-07-23 DIAGNOSIS — N6012 Diffuse cystic mastopathy of left breast: Secondary | ICD-10-CM | POA: Diagnosis not present

## 2017-07-23 NOTE — Progress Notes (Addendum)
GYNECOLOGY  VISIT   HPI: 51 y.o.   Single  Caucasian  female   G3P3 with Patient's last menstrual period was 02/16/2009.   here for left breast recheck and f/u lab work. Her last calcium level was mildly elevated. At her annual exam she was noted to have increased nodularity in her left breast at 10 and 1 o'clock. She is not able to feel any abnormalities in her breasts.   GYNECOLOGIC HISTORY: Patient's last menstrual period was 02/16/2009. Contraception:hysterectomy  Menopausal hormone therapy: none         OB History    Gravida Para Term Preterm AB Living   3 3       3    SAB TAB Ectopic Multiple Live Births                     There are no active problems to display for this patient.   Past Medical History:  Diagnosis Date  . Depression   . Fibroid     Past Surgical History:  Procedure Laterality Date  . BREAST BIOPSY     benign  . LAPAROSCOPIC TOTAL HYSTERECTOMY  02/16/09   Robotic assisted  . NOVASURE ABLATION    . TUBAL LIGATION      Current Outpatient Prescriptions  Medication Sig Dispense Refill  . Multiple Vitamin (MULTIVITAMIN) tablet Take 1 tablet by mouth daily.     No current facility-administered medications for this visit.      ALLERGIES: Patient has no known allergies.  Family History  Problem Relation Age of Onset  . Rheum arthritis Mother   . Hypertension Mother   . Alcoholism Father   . Stroke Maternal Grandmother        mini strokes  . Heart disease Maternal Grandfather        pacemaker  . Diabetes Maternal Grandfather   . Other Brother        vasculitis disease-unsure of all diag  . Heart disease Paternal Grandfather     Social History   Social History  . Marital status: Single    Spouse name: N/A  . Number of children: N/A  . Years of education: N/A   Occupational History  . Not on file.   Social History Main Topics  . Smoking status: Current Every Day Smoker    Packs/day: 1.00    Types: Cigarettes  . Smokeless tobacco:  Never Used  . Alcohol use No  . Drug use: No  . Sexual activity: Yes    Partners: Male    Birth control/ protection: Surgical     Comment: TLH   Other Topics Concern  . Not on file   Social History Narrative  . No narrative on file    Review of Systems  Constitutional: Negative.   HENT: Negative.   Eyes: Negative.   Respiratory: Negative.   Cardiovascular: Negative.   Gastrointestinal: Negative.   Genitourinary: Negative.   Musculoskeletal: Negative.   Skin: Negative.   Neurological: Negative.   Endo/Heme/Allergies: Negative.   Psychiatric/Behavioral: Negative.     PHYSICAL EXAMINATION:    BP 110/70 (BP Location: Right Arm, Patient Position: Sitting, Cuff Size: Normal)   Pulse 80   Resp 16   Wt 137 lb (62.1 kg)   LMP 02/16/2009   BMI 21.95 kg/m     General appearance: alert, cooperative and appears stated age Breasts: stable bean shaped lump in the left breast at 10 o'clock, under 1 cm, not tender. No other lumps, no  skin changes  No supraclavicular or axillary adenopathy   ASSESSMENT Stable left breast lump, per patient normal imaging (will get copy) Elevated calcium    PLAN Continue monthly breast self exams Repeat Calcium and albumin, if calcium is still elevated will send her to her primary for further evaluation   An After Visit Summary was printed and given to the patient.  Over 15 minutes face to face time of which over 50% was spent in counseling.   CC: Dr Stoney Bang  Addendum: mammogram report reviewed. Normal and normal ultrasound.

## 2017-07-24 ENCOUNTER — Telehealth: Payer: Self-pay | Admitting: *Deleted

## 2017-07-24 LAB — CALCIUM: Calcium: 10.1 mg/dL (ref 8.7–10.2)

## 2017-07-24 LAB — ALBUMIN: ALBUMIN: 4.3 g/dL (ref 3.5–5.5)

## 2017-07-24 NOTE — Telephone Encounter (Signed)
Left message to call regarding results -eh 

## 2017-07-24 NOTE — Telephone Encounter (Signed)
-----   Message from Salvadore Dom, MD sent at 07/24/2017 10:36 AM EDT ----- Please inform the patient that her f/u calcium level is normal!! I would just recommend checking it again in one year and avoid extra calcium supplementation.

## 2017-07-24 NOTE — Telephone Encounter (Signed)
Left detailed message with results and recommendations per patient request. -eh

## 2017-07-24 NOTE — Telephone Encounter (Signed)
Patient returning your call.  States it is ok to leave detailed message if she does not answer.

## 2017-08-07 ENCOUNTER — Encounter: Payer: Self-pay | Admitting: Obstetrics and Gynecology

## 2017-12-03 ENCOUNTER — Telehealth: Payer: Self-pay | Admitting: Obstetrics and Gynecology

## 2017-12-03 NOTE — Telephone Encounter (Signed)
Left message on voicemail to call and reschedule cancelled appointment. °

## 2018-02-18 DIAGNOSIS — M79672 Pain in left foot: Secondary | ICD-10-CM | POA: Diagnosis not present

## 2018-02-18 DIAGNOSIS — M25579 Pain in unspecified ankle and joints of unspecified foot: Secondary | ICD-10-CM | POA: Diagnosis not present

## 2018-03-06 DIAGNOSIS — Z6822 Body mass index (BMI) 22.0-22.9, adult: Secondary | ICD-10-CM | POA: Diagnosis not present

## 2018-03-06 DIAGNOSIS — R1084 Generalized abdominal pain: Secondary | ICD-10-CM | POA: Diagnosis not present

## 2018-03-07 DIAGNOSIS — R1084 Generalized abdominal pain: Secondary | ICD-10-CM | POA: Diagnosis not present

## 2018-03-07 DIAGNOSIS — K824 Cholesterolosis of gallbladder: Secondary | ICD-10-CM | POA: Diagnosis not present

## 2018-03-13 DIAGNOSIS — R1084 Generalized abdominal pain: Secondary | ICD-10-CM | POA: Diagnosis not present

## 2018-03-13 DIAGNOSIS — R109 Unspecified abdominal pain: Secondary | ICD-10-CM | POA: Diagnosis not present

## 2018-03-18 ENCOUNTER — Telehealth: Payer: Self-pay | Admitting: Obstetrics and Gynecology

## 2018-03-18 NOTE — Telephone Encounter (Signed)
Patient is stating that she is having pain at the bottom of her rib cage. Stated that she has been seen for gallbladder and those tests were negative. Patient has had an ultrasound and a hida test. No blood.

## 2018-03-18 NOTE — Telephone Encounter (Signed)
Patient called complaining of RUQ pain just above the last rib.She denies that it is breast pain. She states she has had a negative RUQ U/S, and neg Hida scan through PCP. Advised patient this was generally not our expertise and would recommend she follow up with her PCP.Suggested to her if no response from PCP we would be happy to see her.Routed to provider

## 2018-06-10 ENCOUNTER — Ambulatory Visit: Payer: BLUE CROSS/BLUE SHIELD | Admitting: Obstetrics and Gynecology

## 2018-06-24 ENCOUNTER — Encounter: Payer: Self-pay | Admitting: Obstetrics and Gynecology

## 2018-06-24 ENCOUNTER — Other Ambulatory Visit: Payer: Self-pay

## 2018-06-24 ENCOUNTER — Ambulatory Visit (INDEPENDENT_AMBULATORY_CARE_PROVIDER_SITE_OTHER): Payer: BLUE CROSS/BLUE SHIELD | Admitting: Obstetrics and Gynecology

## 2018-06-24 VITALS — BP 122/80 | HR 76 | Ht 66.14 in | Wt 145.6 lb

## 2018-06-24 DIAGNOSIS — Z0000 Encounter for general adult medical examination without abnormal findings: Secondary | ICD-10-CM

## 2018-06-24 DIAGNOSIS — Z01419 Encounter for gynecological examination (general) (routine) without abnormal findings: Secondary | ICD-10-CM | POA: Diagnosis not present

## 2018-06-24 DIAGNOSIS — Z1211 Encounter for screening for malignant neoplasm of colon: Secondary | ICD-10-CM | POA: Diagnosis not present

## 2018-06-24 NOTE — Progress Notes (Signed)
52 y.o. G3P3 SingleCaucasianF here for annual exam.  H/O TLH in 2010. She does shift work, has to rotate days/evenings/nights. Makes her miserable.  She was recently seen by her primary for RUQ abdominal pain.  Not sexually active. No STD worries.  She is having bad vasomotor symptoms, is up 0-4 x a night.      Patient's last menstrual period was 02/16/2009.          Sexually active: No.  The current method of family planning is status post hysterectomy.    Exercising: No.  The patient does not participate in regular exercise at present. Smoker:  yes  Health Maintenance: Pap:  12/31/08 Normal  History of abnormal Pap:  no MMG:  06/13/2017 Bilateral diagnostic and left breast u/s Birads 1 negative Colonoscopy:  Never BMD:   Never  TDaP:  03/2013   reports that she has been smoking cigarettes. She has been smoking about 1.00 pack per day. She has never used smokeless tobacco. She reports that she does not drink alcohol or use drugs. 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 Kids are grown, 6 grandchildren, all local.   Past Medical History:  Diagnosis Date  . Depression   . Fibroid     Past Surgical History:  Procedure Laterality Date  . BREAST BIOPSY     benign  . LAPAROSCOPIC TOTAL HYSTERECTOMY  02/16/09   Robotic assisted  . NOVASURE ABLATION    . TUBAL LIGATION      No current outpatient medications on file.   No current facility-administered medications for this visit.     Family History  Problem Relation Age of Onset  . Rheum arthritis Mother   . Hypertension Mother   . Alcoholism Father   . Stroke Maternal Grandmother        mini strokes  . Heart disease Maternal Grandfather        pacemaker  . Diabetes Maternal Grandfather   . Other Brother        vasculitis disease-unsure of all diag  . Heart disease Paternal Grandfather     Review of Systems  Constitutional: Negative.   HENT: Negative.   Eyes: Negative.   Cardiovascular: Negative.   Gastrointestinal: Negative.    Endocrine: Negative.   Genitourinary:       Loss of urine with sneeze or cough  Musculoskeletal:       Hip pain  Skin: Negative.   Allergic/Immunologic: Negative.   Neurological: Negative.   Psychiatric/Behavioral: Negative.   No change in [place], just on occasion  Exam:   BP 122/80 (BP Location: Right Arm, Patient Position: Sitting, Cuff Size: Normal)   Pulse 76   Ht 5' 6.14" (1.68 m)   Wt 145 lb 9.6 oz (66 kg)   LMP 02/16/2009   BMI 23.40 kg/m   Weight change: @WEIGHTCHANGE @ Height:   Height: 5' 6.14" (168 cm)  Ht Readings from Last 3 Encounters:  06/24/18 5' 6.14" (1.68 m)  06/07/17 5' 6.25" (1.683 m)  05/18/16 5\' 6"  (1.676 m)    General appearance: alert, cooperative and appears stated age Head: Normocephalic, without obvious abnormality, atraumatic Neck: no adenopathy, supple, symmetrical, trachea midline and thyroid normal to inspection and palpation Lungs: clear to auscultation bilaterally Cardiovascular: regular rate and rhythm Breasts: normal appearance, no masses or tenderness Abdomen: soft, non-tender; non distended,  no masses,  no organomegaly Extremities: extremities normal, atraumatic, no cyanosis or edema Skin: Skin color, texture, turgor normal. No rashes or lesions Lymph nodes: Cervical, supraclavicular, and axillary nodes  normal. No abnormal inguinal nodes palpated Neurologic: Grossly normal   Pelvic: External genitalia:  no lesions              Urethra:  normal appearing urethra with no masses, tenderness or lesions              Bartholins and Skenes: normal                 Vagina: normal appearing vagina with normal color and discharge, no lesions              Cervix: absent               Bimanual Exam:  Uterus:  uterus absent              Adnexa: no mass, fullness, tenderness               Rectovaginal: Confirms               Anus:  normal sphincter tone, no lesions  Chaperone was present for exam.  A:  Well Woman with normal exam  Smoker,  information on quitting given  Vasomotor symptoms, discussed behavioral changes  P:   No Pap needed  Mammogram due  Colonoscopy referral placed  Screening labs  Discussed breast self exam  Discussed calcium and vit D intake

## 2018-06-24 NOTE — Patient Instructions (Addendum)
EXERCISE AND DIET:  We recommended that you start or continue a regular exercise program for good health. Regular exercise means any activity that makes your heart beat faster and makes you sweat.  We recommend exercising at least 30 minutes per day at least 3 days a week, preferably 4 or 5.  We also recommend a diet low in fat and sugar.  Inactivity, poor dietary choices and obesity can cause diabetes, heart attack, stroke, and kidney damage, among others.    ALCOHOL AND SMOKING:  Women should limit their alcohol intake to no more than 7 drinks/beers/glasses of wine (combined, not each!) per week. Moderation of alcohol intake to this level decreases your risk of breast cancer and liver damage. And of course, no recreational drugs are part of a healthy lifestyle.  And absolutely no smoking or even second hand smoke. Most people know smoking can cause heart and lung diseases, but did you know it also contributes to weakening of your bones? Aging of your skin?  Yellowing of your teeth and nails?  CALCIUM AND VITAMIN D:  Adequate intake of calcium and Vitamin D are recommended.  The recommendations for exact amounts of these supplements seem to change often, but generally speaking 600 mg of calcium (either carbonate or citrate) and 800 units of Vitamin D per day seems prudent. Certain women may benefit from higher intake of Vitamin D.  If you are among these women, your doctor will have told you during your visit.    PAP SMEARS:  Pap smears, to check for cervical cancer or precancers,  have traditionally been done yearly, although recent scientific advances have shown that most women can have pap smears less often.  However, every woman still should have a physical exam from her gynecologist every year. It will include a breast check, inspection of the vulva and vagina to check for abnormal growths or skin changes, a visual exam of the cervix, and then an exam to evaluate the size and shape of the uterus and  ovaries.  And after 54 years of age, a rectal exam is indicated to check for rectal cancers. We will also provide age appropriate advice regarding health maintenance, like when you should have certain vaccines, screening for sexually transmitted diseases, bone density testing, colonoscopy, mammograms, etc.   MAMMOGRAMS:  All women over 40 years old should have a yearly mammogram. Many facilities now offer a "3D" mammogram, which may cost around $50 extra out of pocket. If possible,  we recommend you accept the option to have the 3D mammogram performed.  It both reduces the number of women who will be called back for extra views which then turn out to be normal, and it is better than the routine mammogram at detecting truly abnormal areas.    COLONOSCOPY:  Colonoscopy to screen for colon cancer is recommended for all women at age 50.  We know, you hate the idea of the prep.  We agree, BUT, having colon cancer and not knowing it is worse!!  Colon cancer so often starts as a polyp that can be seen and removed at colonscopy, which can quite literally save your life!  And if your first colonoscopy is normal and you have no family history of colon cancer, most women don't have to have it again for 10 years.  Once every ten years, you can do something that may end up saving your life, right?  We will be happy to help you get it scheduled when you are ready.    Be sure to check your insurance coverage so you understand how much it will cost.  It may be covered as a preventative service at no cost, but you should check your particular policy.      Steps to Quit Smoking Smoking tobacco can be harmful to your health and can affect almost every organ in your body. Smoking puts you, and those around you, at risk for developing many serious chronic diseases. Quitting smoking is difficult, but it is one of the best things that you can do for your health. It is never too late to quit. What are the benefits of quitting  smoking? When you quit smoking, you lower your risk of developing serious diseases and conditions, such as:  Lung cancer or lung disease, such as COPD.  Heart disease.  Stroke.  Heart attack.  Infertility.  Osteoporosis and bone fractures.  Additionally, symptoms such as coughing, wheezing, and shortness of breath may get better when you quit. You may also find that you get sick less often because your body is stronger at fighting off colds and infections. If you are pregnant, quitting smoking can help to reduce your chances of having a baby of low birth weight. How do I get ready to quit? When you decide to quit smoking, create a plan to make sure that you are successful. Before you quit:  Pick a date to quit. Set a date within the next two weeks to give you time to prepare.  Write down the reasons why you are quitting. Keep this list in places where you will see it often, such as on your bathroom mirror or in your car or wallet.  Identify the people, places, things, and activities that make you want to smoke (triggers) and avoid them. Make sure to take these actions: ? Throw away all cigarettes at home, at work, and in your car. ? Throw away smoking accessories, such as Scientist, research (medical). ? Clean your car and make sure to empty the ashtray. ? Clean your home, including curtains and carpets.  Tell your family, friends, and coworkers that you are quitting. Support from your loved ones can make quitting easier.  Talk with your health care provider about your options for quitting smoking.  Find out what treatment options are covered by your health insurance.  What strategies can I use to quit smoking? Talk with your healthcare provider about different strategies to quit smoking. Some strategies include:  Quitting smoking altogether instead of gradually lessening how much you smoke over a period of time. Research shows that quitting "cold [place]" is more successful than  gradually quitting.  Attending in-person counseling to help you build problem-solving skills. You are more likely to have success in quitting if you attend several counseling sessions. Even short sessions of 10 minutes can be effective.  Finding resources and support systems that can help you to quit smoking and remain smoke-free after you quit. These resources are most helpful when you use them often. They can include: ? Online chats with a Social worker. ? Telephone quitlines. ? Careers information officer. ? Support groups or group counseling. ? Text messaging programs. ? Mobile phone applications.  Taking medicines to help you quit smoking. (If you are pregnant or breastfeeding, talk with your health care provider first.) Some medicines contain nicotine and some do not. Both types of medicines help with cravings, but the medicines that include nicotine help to relieve withdrawal symptoms. Your health care provider may recommend: ? Nicotine patches, gum, or  lozenges. ? Nicotine inhalers or sprays. ? Non-nicotine medicine that is taken by mouth.  Talk with your health care provider about combining strategies, such as taking medicines while you are also receiving in-person counseling. Using these two strategies together makes you more likely to succeed in quitting than if you used either strategy on its own. If you are pregnant or breastfeeding, talk with your health care provider about finding counseling or other support strategies to quit smoking. Do not take medicine to help you quit smoking unless told to do so by your health care provider. What things can I do to make it easier to quit? Quitting smoking might feel overwhelming at first, but there is a lot that you can do to make it easier. Take these important actions:  Reach out to your family and friends and ask that they support and encourage you during this time. Call telephone quitlines, reach out to support groups, or work with a  counselor for support.  Ask people who smoke to avoid smoking around you.  Breast Self-Awareness Breast self-awareness means being familiar with how your breasts look and feel. It involves checking your breasts regularly and reporting any changes to your health care provider. Practicing breast self-awareness is important. A change in your breasts can be a sign of a serious medical problem. Being familiar with how your breasts look and feel allows you to find any problems early, when treatment is more likely to be successful. All women should practice breast self-awareness, including women who have had breast implants. How to do a breast self-exam One way to learn what is normal for your breasts and whether your breasts are changing is to do a breast self-exam. To do a breast self-exam: Look for Changes  Remove all the clothing above your waist. Stand in front of a mirror in a room with good lighting. Put your hands on your hips. Push your hands firmly downward. Compare your breasts in the mirror. Look for differences between them (asymmetry), such as: Differences in shape. Differences in size. Puckers, dips, and bumps in one breast and not the other. Look at each breast for changes in your skin, such as: Redness. Scaly areas. Look for changes in your nipples, such as: Discharge. Bleeding. Dimpling. Redness. A change in position. Feel for Changes  Carefully feel your breasts for lumps and changes. It is best to do this while lying on your back on the floor and again while sitting or standing in the shower or tub with soapy water on your skin. Feel each breast in the following way: Place the arm on the side of the breast you are examining above your head. Feel your breast with the other hand. Start in the nipple area and make  inch (2 cm) overlapping circles to feel your breast. Use the pads of your three middle fingers to do this. Apply light pressure, then medium pressure, then firm  pressure. The light pressure will allow you to feel the tissue closest to the skin. The medium pressure will allow you to feel the tissue that is a little deeper. The firm pressure will allow you to feel the tissue close to the ribs. Continue the overlapping circles, moving downward over the breast until you feel your ribs below your breast. Move one finger-width toward the center of the body. Continue to use the  inch (2 cm) overlapping circles to feel your breast as you move slowly up toward your collarbone. Continue the up and down exam using  all three pressures until you reach your armpit.  Write Down What You Find  Write down what is normal for each breast and any changes that you find. Keep a written record with breast changes or normal findings for each breast. By writing this information down, you do not need to depend only on memory for size, tenderness, or location. Write down where you are in your menstrual cycle, if you are still menstruating. If you are having trouble noticing differences in your breasts, do not get discouraged. With time you will become more familiar with the variations in your breasts and more comfortable with the exam. How often should I examine my breasts? Examine your breasts every month. If you are breastfeeding, the best time to examine your breasts is after a feeding or after using a breast pump. If you menstruate, the best time to examine your breasts is 5-7 days after your period is over. During your period, your breasts are lumpier, and it may be more difficult to notice changes. When should I see my health care provider? See your health care provider if you notice: A change in shape or size of your breasts or nipples. A change in the skin of your breast or nipples, such as a reddened or scaly area. Unusual discharge from your nipples. A lump or thick area that was not there before. Pain in your breasts. Anything that concerns you.  This information is  not intended to replace advice given to you by your health care provider. Make sure you discuss any questions you have with your health care provider. Document Released: 10/30/2005 Document Revised: 04/06/2016 Document Reviewed: 09/19/2015 Elsevier Interactive Patient Education  [employer].  Avoid places that trigger you to smoke, such as bars, parties, or smoke-break areas at work.  Spend time around people who do not smoke.  Lessen stress in your life, because stress can be a smoking trigger for some people. To lessen stress, try: ? Exercising regularly. ? Deep-breathing exercises. ? Yoga. ? Meditating. ? Performing a body scan. This involves closing your eyes, scanning your body from head to toe, and noticing which parts of your body are particularly tense. Purposefully relax the muscles in those areas.  Download or purchase mobile phone or tablet apps (applications) that can help you stick to your quit plan by providing reminders, tips, and encouragement. There are many free apps, such as QuitGuide from the [employer] Office manager for Disease Control and Prevention). You can find other support for quitting smoking (smoking cessation) through smokefree.gov and other websites.  How will I feel when I quit smoking? Within the first 24 hours of quitting smoking, you may start to feel some withdrawal symptoms. These symptoms are usually most noticeable 2-3 days after quitting, but they usually do not last beyond 2-3 weeks. Changes or symptoms that you might experience include:  Mood swings.  Restlessness, anxiety, or irritation.  Difficulty concentrating.  Dizziness.  Strong cravings for sugary foods in addition to nicotine.  Mild weight gain.  Constipation.  Nausea.  Coughing or a sore throat.  Changes in how your medicines work in your body.  A depressed mood.  Difficulty sleeping (insomnia).  After the first 2-3 weeks of quitting, you may start to notice more positive  results, such as:  Improved sense of smell and taste.  Decreased coughing and sore throat.  Slower heart rate.  Lower blood pressure.  Clearer skin.  The ability to breathe more easily.  Fewer sick days.  Quitting  smoking is very challenging for most people. Do not get discouraged if you are not successful the first time. Some people need to make many attempts to quit before they achieve long-term success. Do your best to stick to your quit plan, and talk with your health care provider if you have any questions or concerns. This information is not intended to replace advice given to you by your health care provider. Make sure you discuss any questions you have with your health care provider. Document Released: 10/24/2001 Document Revised: 06/27/2016 Document Reviewed: 03/16/2015 Elsevier Interactive Patient Education  2018 [employer].   Kegel Exercises Kegel exercises help strengthen the muscles that support the rectum, vagina, small intestine, bladder, and uterus. Doing Kegel exercises can help:  Improve bladder and bowel control.  Improve sexual response.  Reduce problems and discomfort during pregnancy.  Kegel exercises involve squeezing your pelvic floor muscles, which are the same muscles you squeeze when you try to stop the flow of urine. The exercises can be done while sitting, standing, or lying down, but it is best to vary your position. Phase 1 exercises 1. Squeeze your pelvic floor muscles tight. You should feel a tight lift in your rectal area. If you are a female, you should also feel a tightness in your vaginal area. Keep your stomach, buttocks, and legs relaxed. 2. Hold the muscles tight for up to 10 seconds. 3. Relax your muscles. Repeat this exercise 50 times a day or as many times as told by your health care provider. Continue to do this exercise for at least 4-6 weeks or for as long as told by your health care provider. This information is not intended to  replace advice given to you by your health care provider. Make sure you discuss any questions you have with your health care provider. Document Released: 10/16/2012 Document Revised: 06/24/2016 Document Reviewed: 09/19/2015 Elsevier Interactive Patient Education  [employer].

## 2018-06-25 LAB — COMPREHENSIVE METABOLIC PANEL
ALBUMIN: 4.5 g/dL (ref 3.5–5.5)
ALT: 22 IU/L (ref 0–32)
AST: 27 IU/L (ref 0–40)
Albumin/Globulin Ratio: 1.6 (ref 1.2–2.2)
BUN / CREAT RATIO: 15 (ref 9–23)
BUN: 13 mg/dL (ref 6–24)
Bilirubin Total: 0.4 mg/dL (ref 0.0–1.2)
Creatinine, Ser: 0.86 mg/dL (ref 0.57–1.00)
GFR calc Af Amer: 89 mL/min/{1.73_m2} (ref >59)
GFR calc non Af Amer: 77 mL/min/{1.73_m2} (ref >59)
GLOBULIN, TOTAL: 2.9 g/dL (ref 1.5–4.5)
GLUCOSE: 73 mg/dL (ref 65–99)
Total Protein: 7.4 g/dL (ref 6.0–8.5)

## 2018-06-25 LAB — CBC
HEMATOCRIT: 42.9 % (ref 34.0–46.6)
HEMOGLOBIN: 14 g/dL (ref 11.1–15.9)
MCH: 31.9 pg (ref 26.6–33.0)
MCHC: 32.6 g/dL (ref 31.5–35.7)
MCV: 98 fL — ABNORMAL HIGH (ref 79–97)
Platelets: 303 10*3/uL (ref 150–450)
RBC: 4.39 x10E6/uL (ref 3.77–5.28)
RDW: 13 % (ref 12.3–15.4)
WBC: 6.4 10*3/uL (ref 3.4–10.8)

## 2018-07-08 ENCOUNTER — Telehealth: Payer: Self-pay | Admitting: Obstetrics and Gynecology

## 2018-07-08 DIAGNOSIS — Z0000 Encounter for general adult medical examination without abnormal findings: Secondary | ICD-10-CM

## 2018-07-08 NOTE — Telephone Encounter (Signed)
Call to [facility]. Confirmed they will be reaching out to patient to explain processing issue and offer re-draw.  Call to patient to provide update. Declined call from [place] needed.  Requests to reschedule for 07-22-18 due to work schedule.   See CMP result from 06-24-18.   Encounter closed.

## 2018-07-08 NOTE — Telephone Encounter (Signed)
Patient has some questions about her calcium level. Patient states that her calcium level was high in the past.

## 2018-07-22 ENCOUNTER — Other Ambulatory Visit: Payer: Self-pay | Admitting: *Deleted

## 2018-07-22 ENCOUNTER — Other Ambulatory Visit: Payer: BLUE CROSS/BLUE SHIELD

## 2018-07-22 ENCOUNTER — Telehealth: Payer: Self-pay | Admitting: Obstetrics and Gynecology

## 2018-07-22 DIAGNOSIS — Z0000 Encounter for general adult medical examination without abnormal findings: Secondary | ICD-10-CM

## 2018-07-22 NOTE — Telephone Encounter (Signed)
Patient returned call. Scheduled for labs Thursday 9/12 at 9:55.

## 2018-07-22 NOTE — Addendum Note (Signed)
Addended by: Michele Mcalpine on: 07/22/2018 08:26 AM   Modules accepted: Orders

## 2018-07-22 NOTE — Telephone Encounter (Signed)
Left message on voicemail regarding missed lab appointment. °

## 2018-07-24 ENCOUNTER — Telehealth: Payer: Self-pay | Admitting: Obstetrics and Gynecology

## 2018-07-24 NOTE — Telephone Encounter (Signed)
Call placed in reference to a referral to [facility].

## 2018-07-25 ENCOUNTER — Other Ambulatory Visit (INDEPENDENT_AMBULATORY_CARE_PROVIDER_SITE_OTHER): Payer: BLUE CROSS/BLUE SHIELD

## 2018-07-25 DIAGNOSIS — Z0000 Encounter for general adult medical examination without abnormal findings: Secondary | ICD-10-CM

## 2018-07-26 DIAGNOSIS — H524 Presbyopia: Secondary | ICD-10-CM | POA: Diagnosis not present

## 2018-07-26 LAB — COMPREHENSIVE METABOLIC PANEL
ALK PHOS: 92 IU/L (ref 39–117)
ALT: 13 IU/L (ref 0–32)
AST: 16 IU/L (ref 0–40)
Albumin/Globulin Ratio: 2.1 (ref 1.2–2.2)
Albumin: 4.6 g/dL (ref 3.5–5.5)
BILIRUBIN TOTAL: 0.3 mg/dL (ref 0.0–1.2)
BUN/Creatinine Ratio: 23 (ref 9–23)
BUN: 19 mg/dL (ref 6–24)
CHLORIDE: 106 mmol/L (ref 96–106)
CO2: 23 mmol/L (ref 20–29)
Calcium: 10.6 mg/dL — ABNORMAL HIGH (ref 8.7–10.2)
Creatinine, Ser: 0.84 mg/dL (ref 0.57–1.00)
GFR calc Af Amer: 91 mL/min/{1.73_m2} (ref >59)
GFR calc non Af Amer: 79 mL/min/{1.73_m2} (ref >59)
GLUCOSE: 85 mg/dL (ref 65–99)
Globulin, Total: 2.2 g/dL (ref 1.5–4.5)
POTASSIUM: 4.2 mmol/L (ref 3.5–5.2)
Sodium: 143 mmol/L (ref 134–144)
Total Protein: 6.8 g/dL (ref 6.0–8.5)

## 2018-07-29 ENCOUNTER — Telehealth: Payer: Self-pay | Admitting: *Deleted

## 2018-07-29 DIAGNOSIS — Z8639 Personal history of other endocrine, nutritional and metabolic disease: Secondary | ICD-10-CM

## 2018-07-29 NOTE — Telephone Encounter (Signed)
-----   Message from Salvadore Dom, MD sent at 07/28/2018  2:48 PM EDT ----- Please inform the patient that her calcium (including corrected calcium for albumin) is elevated again. This happened last year and the repeat level was normal. The recommendation is to recheck her calcium and albumin level. Please have her come back for this again and add a PTH.  Because of the issue with her last draw with [facility], will send this message to [name]

## 2018-07-29 NOTE — Telephone Encounter (Signed)
Call to patient. Per ROI, can leave message on voice mail. Requested call back. Ask for Gay Filler.

## 2018-07-29 NOTE — Telephone Encounter (Signed)
Patient returned call to Sally. °

## 2018-08-05 NOTE — Telephone Encounter (Signed)
Call from patient approximately 1030. Reviewed lab results and recommendations from Dr Talbert Nan. Appointment scheduled for 08-09-18 at 0900.   Encounter closed.

## 2018-08-09 ENCOUNTER — Other Ambulatory Visit (INDEPENDENT_AMBULATORY_CARE_PROVIDER_SITE_OTHER): Payer: BLUE CROSS/BLUE SHIELD

## 2018-08-09 DIAGNOSIS — Z8639 Personal history of other endocrine, nutritional and metabolic disease: Secondary | ICD-10-CM | POA: Diagnosis not present

## 2018-08-10 LAB — PTH, INTACT AND CALCIUM
CALCIUM: 10.2 mg/dL (ref 8.7–10.2)
PTH: 27 pg/mL (ref 15–65)

## 2018-08-26 DIAGNOSIS — H524 Presbyopia: Secondary | ICD-10-CM | POA: Diagnosis not present

## 2018-09-06 ENCOUNTER — Encounter: Payer: Self-pay | Admitting: Internal Medicine

## 2018-09-06 ENCOUNTER — Ambulatory Visit (AMBULATORY_SURGERY_CENTER): Payer: Self-pay

## 2018-09-06 VITALS — Ht 66.8 in | Wt 146.8 lb

## 2018-09-06 DIAGNOSIS — Z1211 Encounter for screening for malignant neoplasm of colon: Secondary | ICD-10-CM

## 2018-09-06 NOTE — Progress Notes (Signed)
Denies allergies to eggs or soy products. Denies complication of anesthesia or sedation. Denies use of weight loss medication. Denies use of O2.   Emmi instructions declined.  

## 2018-09-17 ENCOUNTER — Telehealth: Payer: Self-pay | Admitting: Internal Medicine

## 2018-09-17 ENCOUNTER — Telehealth: Payer: Self-pay

## 2018-09-17 NOTE — Telephone Encounter (Signed)
Returned patients call. A note was printed for her to take to her work. Patient states she lives an hour away and it is too far to drive to pick it up. I don't think there is enough time prior to her procedure to mail the letter. I told Emojean that when she is here for her procedure she could have someone print the letter and give it to the patient and then she could give it to her employer to put in their records.   Riki Sheer, LPN ( [facility] )

## 2018-09-20 ENCOUNTER — Encounter: Payer: Self-pay | Admitting: Internal Medicine

## 2018-09-20 ENCOUNTER — Ambulatory Visit (AMBULATORY_SURGERY_CENTER): Payer: BLUE CROSS/BLUE SHIELD | Admitting: Internal Medicine

## 2018-09-20 VITALS — BP 116/74 | HR 69 | Temp 97.8°F | Resp 16 | Ht 66.8 in | Wt 146.0 lb

## 2018-09-20 DIAGNOSIS — D123 Benign neoplasm of transverse colon: Secondary | ICD-10-CM | POA: Diagnosis not present

## 2018-09-20 DIAGNOSIS — D120 Benign neoplasm of cecum: Secondary | ICD-10-CM | POA: Diagnosis not present

## 2018-09-20 DIAGNOSIS — Z1211 Encounter for screening for malignant neoplasm of colon: Secondary | ICD-10-CM

## 2018-09-20 MED ORDER — SODIUM CHLORIDE 0.9 % IV SOLN: 500.0000 mL | Freq: Once | INTRAVENOUS | Status: DC

## 2018-09-20 NOTE — Op Note (Signed)
[place] Patient Name: Kathryn Church Procedure Date: 09/20/2018 7:48 AM MRN: [id number] Endoscopist: Gatha Mayer , MD Age: 52 Referring MD:  Date of Birth: 04-18-1966 Gender: Female Account #: [id number] Procedure:                Colonoscopy Indications:              Screening for colorectal malignant neoplasm, This                            is the patient's first colonoscopy Medicines:                Propofol per Anesthesia, Monitored Anesthesia Care Procedure:                Pre-Anesthesia Assessment:                           - Prior to the procedure, a History and Physical                            was performed, and patient medications and                            allergies were reviewed. The patient's tolerance of                            previous anesthesia was also reviewed. The risks                            and benefits of the procedure and the sedation                            options and risks were discussed with the patient.                            All questions were answered, and informed consent                            was obtained. Prior Anticoagulants: The patient has                            taken no previous anticoagulant or antiplatelet                            agents. ASA Grade Assessment: II - A patient with                            mild systemic disease. After reviewing the risks                            and benefits, the patient was deemed in                            satisfactory condition to undergo the procedure.  After obtaining informed consent, the colonoscope                            was passed under direct vision. Throughout the                            procedure, the patient's blood pressure, pulse, and                            oxygen saturations were monitored continuously. The                            Model CF-HQ190L [id number]) scope was introduced   through the anus and advanced to the the ileocecal                            valve. The colonoscopy was somewhat difficult due                            to significant looping. Successful completion of                            the procedure was aided by applying abdominal                            pressure. The patient tolerated the procedure well.                            The quality of the bowel preparation was excellent.                            The bowel preparation used was Miralax. Scope In: 7:56:35 AM Scope Out: 8:21:48 AM Scope Withdrawal Time: 0 hours 18 minutes 38 seconds  Total Procedure Duration: 0 hours 25 minutes 13 seconds  Findings:                 The perianal and digital rectal examinations were                            normal.                           A 20 to 25 mm polyp was found in the cecum. The                            polyp was carpet-like, flat and sessile. Biopsies                            were taken with a cold forceps for histology.                            Verification of patient identification for the  specimen was done. Estimated blood loss was minimal.                           Three sessile polyps were found in the transverse                            colon. The polyps were diminutive in size. These                            polyps were removed with a cold snare. Resection                            and retrieval were complete. Verification of                            patient identification for the specimen was done.                            Estimated blood loss was minimal.                           A 1 to 2 mm polyp was found in the transverse                            colon. The polyp was sessile. The polyp was removed                            with a cold biopsy forceps. Resection and retrieval                            were complete. Verification of patient                            identification  for the specimen was done. Estimated                            blood loss was minimal.                           The exam was otherwise without abnormality. Complications:            No immediate complications. Estimated Blood Loss:     Estimated blood loss was minimal. Impression:               - One 20 to 25 mm polyp in the cecum. Biopsied.                           - Three diminutive polyps in the transverse colon,                            removed with a cold snare. Resected and retrieved.                           - One 1  to 2 mm polyp in the transverse colon,                            removed with a cold biopsy forceps. Resected and                            retrieved.                           - The examination was otherwise normal. Unable to                            retroflex - multiple white flat polyps that                            flattened with air in rectum c/w distal                            hyperplastic polyps. Recommendation:           - Patient has a contact number available for                            emergencies. The signs and symptoms of potential                            delayed complications were discussed with the                            patient. Return to normal activities tomorrow.                            Written discharge instructions were provided to the                            patient.                           - Resume previous diet.                           - Continue present medications.                           - Repeat colonoscopy is recommended. The                            colonoscopy date will be determined after pathology                            results from today's exam become available for                            review.                           - Large cecal  polyp needs removal - specialized                            polypectomy with colonoscopy (favor) vs surgery. Gatha Mayer, MD 09/20/2018 8:34:33 AM This  report has been signed electronically.

## 2018-09-20 NOTE — Patient Instructions (Addendum)
I found 5 polyps - 4 tiny ones were removed.  One large one biopsied.  That will either need surgery or most likely specialized removal with a colonoscopy.  I will explain after pathology results are in.  I do not see anything that looks like cancer.  I appreciate the opportunity to care for you. Gatha Mayer, MD, FACG   YOU HAD AN ENDOSCOPIC PROCEDURE TODAY AT [facility]:   Refer to the procedure report that was given to you for any specific questions about what was found during the examination.  If the procedure report does not answer your questions, please call your gastroenterologist to clarify.  If you requested that your care partner not be given the details of your procedure findings, then the procedure report has been included in a sealed envelope for you to review at your convenience later.  YOU SHOULD EXPECT: Some feelings of bloating in the abdomen. Passage of more gas than usual.  Walking can help get rid of the air that was put into your GI tract during the procedure and reduce the bloating. If you had a lower endoscopy (such as a colonoscopy or flexible sigmoidoscopy) you may notice spotting of blood in your stool or on the toilet paper. If you underwent a bowel prep for your procedure, you may not have a normal bowel movement for a few days.  Please Note:  You might notice some irritation and congestion in your nose or some drainage.  This is from the oxygen used during your procedure.  There is no need for concern and it should clear up in a day or so.  SYMPTOMS TO REPORT IMMEDIATELY:   Following lower endoscopy (colonoscopy or flexible sigmoidoscopy):  Excessive amounts of blood in the stool  Significant tenderness or worsening of abdominal pains  Swelling of the abdomen that is new, acute  Fever of 100F or higher   For urgent or emergent issues, a gastroenterologist can be reached at any hour by calling [phone].   DIET:  We do  recommend a small meal at first, but then you may proceed to your regular diet.  Drink plenty of fluids but you should avoid alcoholic beverages for 24 hours.  ACTIVITY:  You should plan to take it easy for the rest of today and you should NOT DRIVE or use heavy machinery until tomorrow (because of the sedation medicines used during the test).    FOLLOW UP: Our staff will call the number listed on your records the next business day following your procedure to check on you and address any questions or concerns that you may have regarding the information given to you following your procedure. If we do not reach you, we will leave a message.  However, if you are feeling well and you are not experiencing any problems, there is no need to return our call.  We will assume that you have returned to your regular daily activities without incident.  If any biopsies were taken you will be contacted by phone or by letter within the next 1-3 weeks.  Please call us at [phone] if you have not heard about the biopsies in 3 weeks.    SIGNATURES/CONFIDENTIALITY: You and/or your care partner have signed paperwork which will be entered into your electronic medical record.  These signatures attest to the fact that that the information above on your After Visit Summary has been reviewed and is understood.  Full responsibility of the confidentiality  of this discharge information lies with you and/or your care-partner.  Read all handouts given to you by your recovery room nurse.

## 2018-09-20 NOTE — Progress Notes (Signed)
Pt's states no medical or surgical changes since previsit or office visit.Patient consents to observer being present for procedure.  

## 2018-09-20 NOTE — Progress Notes (Signed)
Called to room to assist during endoscopic procedure.  Patient ID and intended procedure confirmed with present staff. Received instructions for my participation in the procedure from the performing physician.  

## 2018-09-20 NOTE — Progress Notes (Signed)
To PACU, VSS. Report to Rn.tb 

## 2018-09-23 ENCOUNTER — Telehealth: Payer: Self-pay

## 2018-09-23 NOTE — Telephone Encounter (Signed)
No answer, left message to call back later today, B.Creedence Heiss RN. 

## 2018-09-23 NOTE — Telephone Encounter (Signed)
Attempted to reach pt. With follow-up call following endoscopic procedure 09/20/2018.  LM on pt. Voice mail to call if she has any questions or concerns.

## 2018-09-25 ENCOUNTER — Telehealth: Payer: Self-pay | Admitting: Internal Medicine

## 2018-09-25 NOTE — Telephone Encounter (Signed)
Patient advised that we are still waiting on her pathology and will call with an plan once he has the pathology and has time to develop a plan

## 2018-09-25 NOTE — Telephone Encounter (Signed)
Pt returning phone call ° °

## 2018-09-25 NOTE — Telephone Encounter (Signed)
Left message for patient to call back  

## 2018-10-02 ENCOUNTER — Telehealth: Payer: Self-pay | Admitting: Internal Medicine

## 2018-10-02 NOTE — Progress Notes (Signed)
20-25 mm cecal adenoma - needs EMR  4 diminutive adenomas  Dr. Rush Landmark reviewing re: EMR - we will call her after that

## 2018-10-02 NOTE — Telephone Encounter (Signed)
Patient would like results from colonoscopy

## 2018-10-02 NOTE — Telephone Encounter (Signed)
I explained the pathology details to her.  She understands that the cecal polyp will require EMR or surgical resection.  She understands that Dr. Rush Landmark will review and will let us know if it is amenable to EMR.  She understands that I will call her back with plans.

## 2018-10-04 NOTE — Telephone Encounter (Signed)
The pt has an appt for 11/15/2018 with Dr Rush Landmark.  She has been advised.

## 2018-10-04 NOTE — Telephone Encounter (Signed)
Spoke to patient - explained things to her re: colonoscopy vs surgery  Next step is for her to see Dr. Jerilynn Mages in office so he can explain options  She is not overly concerned about getting procedure done before end of year

## 2018-10-04 NOTE — Progress Notes (Signed)
I left her a message that I reviewed with Dr. Rush Landmark and that we think a colonoscopy and attempt at removal of this polyp makes the most sense and to try to avoid surgery.  We cannot guarantee that but should be preferable to try that approach.  I left her a message also that I am happy to talk to her more about this but we were going to try to go ahead and schedule it at this time.

## 2018-10-08 DIAGNOSIS — M25579 Pain in unspecified ankle and joints of unspecified foot: Secondary | ICD-10-CM | POA: Diagnosis not present

## 2018-10-08 DIAGNOSIS — M79672 Pain in left foot: Secondary | ICD-10-CM | POA: Diagnosis not present

## 2018-10-16 DIAGNOSIS — D2239 Melanocytic nevi of other parts of face: Secondary | ICD-10-CM | POA: Diagnosis not present

## 2018-10-16 DIAGNOSIS — L570 Actinic keratosis: Secondary | ICD-10-CM | POA: Diagnosis not present

## 2018-10-16 DIAGNOSIS — D485 Neoplasm of uncertain behavior of skin: Secondary | ICD-10-CM | POA: Diagnosis not present

## 2018-10-29 DIAGNOSIS — M25579 Pain in unspecified ankle and joints of unspecified foot: Secondary | ICD-10-CM | POA: Diagnosis not present

## 2018-10-29 DIAGNOSIS — M79672 Pain in left foot: Secondary | ICD-10-CM | POA: Diagnosis not present

## 2018-11-15 ENCOUNTER — Other Ambulatory Visit: Payer: Self-pay | Admitting: Gastroenterology

## 2018-11-15 ENCOUNTER — Telehealth: Payer: Self-pay

## 2018-11-15 ENCOUNTER — Ambulatory Visit: Payer: BLUE CROSS/BLUE SHIELD | Admitting: Gastroenterology

## 2018-11-15 ENCOUNTER — Encounter: Payer: Self-pay | Admitting: Gastroenterology

## 2018-11-15 VITALS — BP 120/72 | HR 84 | Ht 66.14 in | Wt 148.5 lb

## 2018-11-15 DIAGNOSIS — Z86010 Personal history of colonic polyps: Secondary | ICD-10-CM | POA: Diagnosis not present

## 2018-11-15 DIAGNOSIS — K635 Polyp of colon: Secondary | ICD-10-CM

## 2018-11-15 DIAGNOSIS — D126 Benign neoplasm of colon, unspecified: Secondary | ICD-10-CM | POA: Diagnosis not present

## 2018-11-15 DIAGNOSIS — D120 Benign neoplasm of cecum: Secondary | ICD-10-CM

## 2018-11-15 NOTE — Patient Instructions (Addendum)
We will contact you in the next fer weeks to schedule your procedure for February or March. You will need l;abs done 6 weeks prior to the procedure  Thank you for entrusting me with your care and choosing [facility] [place] care.  Dr Rush Landmark

## 2018-11-15 NOTE — Telephone Encounter (Signed)
I notified patient after obtaining the February hospital schedule. Patient is scheduled for a colonoscopy with EMR 12/23/18 at 7:30am at [facility] hospital 90 minute case. She will come in for labs 11/18/18 as per Dr Mansouraty's orders. Patient has a pre visit appointment to go over instructions 12/16/18.

## 2018-11-15 NOTE — Progress Notes (Signed)
[place] VISIT   Primary Care Provider Patient, No Pcp Per No address on file None  Referring Provider Silvano Rusk, MD   Patient Profile: Kathryn Church is a 53 y.o. female with a pmh significant for MDD, Fibroids, Colon Polyps.  The patient presents to the [facility] for an evaluation and management of problem(s) noted below:  Problem List 1. History of colonic polyps   2. Cecal polyp   3. Tubular adenoma of colon     History of Present Illness: The patient is referred by my colleague Dr. Silvano Rusk for discussion about attempt at advanced polyp resection of a cecal polyp that was found on a screening colonoscopy.  Patient underwent a colonoscopy in November and was found to have a 20 to 25 mm cecal polyp.  When discussing this case with Dr. Carlean Purl it is felt that the appendiceal orifice is not involving this particular region.  This was sampled and returned as adenoma.  Patient has no significant GI symptoms and no significant family history for colon cancer.  She denies any abdominal pain.  She has no nausea or vomiting.  She has not had any melena or hematochezia.  GI Review of Systems Positive as above Negative for dysphagia, odynophagia, early satiety, changes in bowel habits  Review of Systems General: Denies fevers/chills/weight loss HEENT: Denies oral lesions Cardiovascular: Denies chest pain Pulmonary: Denies shortness of breath Gastroenterological: See HPI Genitourinary: Denies darkened urine Hematological: Denies easy bruising/bleeding Dermatological: Denies jaundice Psychological: Mood is anxious about next steps   Medications No current outpatient medications on file.   No current facility-administered medications for this visit.     Allergies No Known Allergies  Histories Past Medical History:  Diagnosis Date  . Colon polyps   . Depression   . Fibroid   . Heart murmur    Past Surgical History:    Procedure Laterality Date  . BREAST BIOPSY     benign  . LAPAROSCOPIC TOTAL HYSTERECTOMY  02/16/09   Robotic assisted  . NOVASURE ABLATION    . TUBAL LIGATION     Social History   Socioeconomic History  . Marital status: Single    Spouse name: Not on file  . Number of children: Not on file  . Years of education: Not on file  . Highest education level: Not on file  Occupational History  . Not on file  Social Needs  . Financial resource strain: Not on file  . Food insecurity:    Worry: Not on file    Inability: Not on file  . Transportation needs:    Medical: Not on file    Non-medical: Not on file  Tobacco Use  . Smoking status: Current Every Day Smoker    Packs/day: 1.00    Types: Cigarettes  . Smokeless tobacco: Never Used  Substance and Sexual Activity  . Alcohol use: No  . Drug use: No  . Sexual activity: Not Currently    Partners: Male    Birth control/protection: Surgical    Comment: TLH  Lifestyle  . Physical activity:    Days per week: Not on file    Minutes per session: Not on file  . Stress: Not on file  Relationships  . Social connections:    Talks on phone: Not on file    Gets together: Not on file    Attends religious service: Not on file    Active member of club or organization: Not on file  Attends meetings of clubs or organizations: Not on file    Relationship status: Not on file  . Intimate partner violence:    Fear of current or ex partner: Not on file    Emotionally abused: Not on file    Physically abused: Not on file    Forced sexual activity: Not on file  Other Topics Concern  . Not on file  Social History Narrative  . Not on file   Family History  Problem Relation Age of Onset  . Rheum arthritis Mother   . Hypertension Mother   . Alcoholism Father   . Stroke Maternal Grandmother        mini strokes  . Heart disease Maternal Grandfather        pacemaker  . Diabetes Maternal Grandfather   . Other Brother        vasculitis  disease-unsure of all diag  . Heart disease Paternal Grandfather   . Colon cancer Neg Hx   . Esophageal cancer Neg Hx   . Rectal cancer Neg Hx   . Stomach cancer Neg Hx   . Inflammatory bowel disease Neg Hx   . Liver disease Neg Hx   . Pancreatic cancer Neg Hx    I have reviewed her medical, social, and family history in detail and updated the electronic medical record as necessary.    PHYSICAL EXAMINATION  BP 120/72 (BP Location: Left Arm, Patient Position: Sitting, Cuff Size: Normal)   Pulse 84   Ht 5' 6.14" (1.68 m) Comment: height measured without shoes  Wt 148 lb 8 oz (67.4 kg)   LMP 02/16/2009   BMI 23.87 kg/m  Wt Readings from Last 3 Encounters:  11/15/18 148 lb 8 oz (67.4 kg)  09/20/18 146 lb (66.2 kg)  09/06/18 146 lb 12.8 oz (66.6 kg)  GEN: NAD, appears stated age, doesn't appear chronically ill PSYCH: Cooperative, without pressured speech EYE: Conjunctivae pink, sclerae anicteric ENT: MMM, without oral ulcers NECK: Supple CV: RR without R/Gs  RESP: CTAB posteriorly, without wheezing  GI: NABS, soft, NT/ND, without rebound or guarding, no HSM appreciated MSK/EXT: No lower extremity edema SKIN: No jaundice NEURO:  Alert & Oriented x 3, no focal deficits   REVIEW OF DATA  I reviewed the following data at the time of this encounter:  GI Procedures and Studies  November 2019 colonoscopy - One 20 to 25 mm polyp in the cecum. Biopsied. - Three diminutive polyps in the transverse colon, removed with a cold snare. Resected and retrieved. - One 1 to 2 mm polyp in the transverse colon, removed with a cold biopsy forceps. Resected and retrieved. - The examination was otherwise normal. Unable to retroflex - multiple white flat polyps that flattened with air in rectum c/w distal hyperplastic polyps. Pathology 1. Surgical [P], cecum - TUBULAR ADENOMA - NEGATIVE FOR HIGH-GRADE DYSPLASIA OR MALIGNANCY 2. Surgical [P], transverse, polyp (4) - TUBULAR ADENOMA(S) -  NEGATIVE FOR HIGH-GRADE DYSPLASIA OR MALIGNANCY  Laboratory Studies  Reviewed in epic  Imaging Studies  No relevant studies to review   ASSESSMENT  Ms. Reggio is a 53 y.o. female with a pmh significant for MDD, Fibroids, Colon Polyps.  The patient is seen today for evaluation and management of:  1. History of colonic polyps   2. Cecal polyp   3. Tubular adenoma of colon    This is a clinically and hemodynamically stable patient.  Based upon the description and endoscopic pictures I do feel that it is reasonable to  pursue an Advanced Polypectomy attempt of the polyp/lesion.  Although the pictures are not clearly representative of the appendiceal orifice, after discussing this case with Dr. Carlean Purl (referring provider), it is felt that the appendiceal orifice is away from the cecal lesion.  We discussed some of the techniques of advanced polypectomy which include Endoscopic Mucosal Resection, OVESCO Full-Thickness Resection, Endorotor Morcellation, and Tissue Ablation via Fulguration.  The risks and benefits of endoscopic evaluation were discussed with the patient; these include but are not limited to the risk of perforation, infection, bleeding, missed lesions, lack of diagnosis, severe illness requiring hospitalization, as well as anesthesia and sedation related illnesses.  During attempts at advanced polypectomy, the risks of bleeding and perforation/leak are increased as opposed to diagnostic and screening colonoscopies, and that was discussed with the patient as well.  I did offer, a referral to surgery as well to discuss consideration of a surgical intervention for prior to finalizing decision for attempt at endoscopic removal; the patient has deferred on this.  If, after attempt at removal of the polyp, it is found that the patient has a complication or that an invasive lesion or malignant lesion is found, or that the polyp continues to recur, the patient is aware and understands that a surgery  may still be indicated/required.  In addition, with the possible need for piecemeal resection, subsequent short-interval colonoscopies for follow up and treatment of the lesion may be necessary and was also explained.  I also discussed that if the appendiceal orifice looks to be in the region since she has not had a previous appendectomy removal of this type of polyp will be difficult and we would need to make a decision about our attempt versus having surgery perform an ileocecectomy.  All patient questions were answered, to the best of my ability, and the patient agrees to the aforementioned plan of action with follow-up as indicated.   PLAN  Labs as outlined below within 6 weeks of colonoscopy Proceed with scheduling a colonoscopy with EMR attempt in 90-minute schedule   Orders Placed This Encounter  Procedures  . CBC  . Basic metabolic panel  . Protime-INR    New Prescriptions   No medications on file   Modified Medications   No medications on file    Planned Follow Up: No follow-ups on file.   [name], MD [facility]oscopy Office # [phone]

## 2018-11-18 ENCOUNTER — Other Ambulatory Visit (INDEPENDENT_AMBULATORY_CARE_PROVIDER_SITE_OTHER): Payer: BLUE CROSS/BLUE SHIELD

## 2018-11-18 ENCOUNTER — Encounter: Payer: Self-pay | Admitting: Gastroenterology

## 2018-11-18 DIAGNOSIS — Z860100 Personal history of colon polyps, unspecified: Secondary | ICD-10-CM | POA: Insufficient documentation

## 2018-11-18 DIAGNOSIS — D120 Benign neoplasm of cecum: Secondary | ICD-10-CM

## 2018-11-18 DIAGNOSIS — D126 Benign neoplasm of colon, unspecified: Secondary | ICD-10-CM | POA: Insufficient documentation

## 2018-11-18 DIAGNOSIS — K635 Polyp of colon: Secondary | ICD-10-CM | POA: Insufficient documentation

## 2018-11-18 LAB — BASIC METABOLIC PANEL
BUN: 20 mg/dL (ref 6–23)
CO2: 27 meq/L (ref 19–32)
Calcium: 10.2 mg/dL (ref 8.4–10.5)
Chloride: 108 mEq/L (ref 96–112)
Creatinine, Ser: 0.8 mg/dL (ref 0.40–1.20)
GFR: 79.15 mL/min (ref >60.00)
Glucose, Bld: 115 mg/dL — ABNORMAL HIGH (ref 70–99)
Potassium: 4.4 mEq/L (ref 3.5–5.1)
Sodium: 139 mEq/L (ref 135–145)

## 2018-11-18 LAB — CBC
HCT: 39.5 % (ref 36.0–46.0)
Hemoglobin: 13.4 g/dL (ref 12.0–15.0)
MCHC: 33.9 g/dL (ref 30.0–36.0)
MCV: 95.2 fl (ref 78.0–100.0)
Platelets: 270 10*3/uL (ref 150.0–400.0)
RBC: 4.15 Mil/uL (ref 3.87–5.11)
RDW: 12.7 % (ref 11.5–15.5)
WBC: 5.5 10*3/uL (ref 4.0–10.5)

## 2018-11-18 LAB — PROTIME-INR
INR: 0.9 ratio (ref 0.8–1.0)
PROTHROMBIN TIME: 10.7 s (ref 9.6–13.1)

## 2018-12-16 ENCOUNTER — Ambulatory Visit (AMBULATORY_SURGERY_CENTER): Payer: Self-pay

## 2018-12-16 VITALS — Ht 66.0 in | Wt 146.6 lb

## 2018-12-16 DIAGNOSIS — Z86010 Personal history of colonic polyps: Secondary | ICD-10-CM

## 2018-12-16 NOTE — Progress Notes (Signed)
Denies allergies to eggs or soy products. Denies complication of anesthesia or sedation. Denies use of weight loss medication. Denies use of O2.   Emmi instructions declined.  

## 2018-12-20 ENCOUNTER — Encounter (HOSPITAL_COMMUNITY): Payer: Self-pay | Admitting: *Deleted

## 2018-12-20 ENCOUNTER — Other Ambulatory Visit: Payer: Self-pay

## 2018-12-20 NOTE — Progress Notes (Signed)
Pt denies SOB, chest pain, and being under the care of a cardiologist. Pt denies having a stress test , echo and cardiac cath. Pt denies having an EKG and chest x ray within the last year. Pt denies recent labs. Pt made aware to stop taking Aspirin ( unless advised otherwise by surgeon), vitamins, fish oil and herbal medications. Do not take any NSAIDs ie: Ibuprofen, Advil, Naproxen (Aleve), Motrin, BC and Goody Powder. Pt verbalized understanding of all pre-op instructions.

## 2018-12-22 NOTE — Anesthesia Preprocedure Evaluation (Addendum)
Anesthesia Evaluation  Patient identified by MRN, date of birth, ID band Patient awake    Reviewed: Allergy & Precautions, NPO status , Patient's Chart, lab work & pertinent test results  History of Anesthesia Complications Negative for: history of anesthetic complications  Airway Mallampati: II  TM Distance: >3 FB Neck ROM: Full    Dental no notable dental hx. (+) Dental Advisory Given, Teeth Intact   Pulmonary neg pulmonary ROS, Current Smoker,    Pulmonary exam normal breath sounds clear to auscultation       Cardiovascular negative cardio ROS Normal cardiovascular exam Rhythm:Regular Rate:Normal     Neuro/Psych PSYCHIATRIC DISORDERS Depression negative neurological ROS     GI/Hepatic Neg liver ROS,   Endo/Other  negative endocrine ROS  Renal/GU negative Renal ROS     Musculoskeletal negative musculoskeletal ROS (+)   Abdominal   Peds  Hematology negative hematology ROS (+)   Anesthesia Other Findings Day of surgery medications reviewed with the patient.  Reproductive/Obstetrics                           Anesthesia Physical Anesthesia Plan  ASA: II  Anesthesia Plan: MAC   Post-op Pain Management:    Induction:   PONV Risk Score and Plan: 2 and Ondansetron and Propofol infusion  Airway Management Planned: Natural Airway  Additional Equipment:   Intra-op Plan:   Post-operative Plan:   Informed Consent: I have reviewed the patients History and Physical, chart, labs and discussed the procedure including the risks, benefits and alternatives for the proposed anesthesia with the patient or authorized representative who has indicated his/her understanding and acceptance.     Dental advisory given  Plan Discussed with: CRNA and Anesthesiologist  Anesthesia Plan Comments:        Anesthesia Quick Evaluation

## 2018-12-23 ENCOUNTER — Ambulatory Visit (HOSPITAL_COMMUNITY): Payer: BLUE CROSS/BLUE SHIELD | Admitting: Anesthesiology

## 2018-12-23 ENCOUNTER — Ambulatory Visit (HOSPITAL_COMMUNITY)
Admission: RE | Admit: 2018-12-23 | Discharge: 2018-12-23 | Disposition: A | Discharge status: Home / Self Care | Payer: BLUE CROSS/BLUE SHIELD | Attending: Gastroenterology | Admitting: Gastroenterology

## 2018-12-23 ENCOUNTER — Encounter (HOSPITAL_COMMUNITY)
Admission: RE | Disposition: A | Discharge status: Home / Self Care | Payer: Self-pay | Source: Home / Self Care | Attending: Gastroenterology

## 2018-12-23 ENCOUNTER — Encounter (HOSPITAL_COMMUNITY): Payer: Self-pay | Admitting: *Deleted

## 2018-12-23 DIAGNOSIS — D120 Benign neoplasm of cecum: Secondary | ICD-10-CM | POA: Diagnosis not present

## 2018-12-23 DIAGNOSIS — K6389 Other specified diseases of intestine: Secondary | ICD-10-CM | POA: Diagnosis not present

## 2018-12-23 DIAGNOSIS — F17210 Nicotine dependence, cigarettes, uncomplicated: Secondary | ICD-10-CM | POA: Diagnosis not present

## 2018-12-23 DIAGNOSIS — Z86010 Personal history of colonic polyps: Secondary | ICD-10-CM | POA: Diagnosis not present

## 2018-12-23 DIAGNOSIS — K644 Residual hemorrhoidal skin tags: Secondary | ICD-10-CM | POA: Diagnosis not present

## 2018-12-23 DIAGNOSIS — K635 Polyp of colon: Secondary | ICD-10-CM | POA: Diagnosis not present

## 2018-12-23 HISTORY — DX: Presence of spectacles and contact lenses: Z97.3

## 2018-12-23 HISTORY — DX: Family history of other specified conditions: Z84.89

## 2018-12-23 HISTORY — PX: FOREIGN BODY REMOVAL: SHX962

## 2018-12-23 HISTORY — DX: Pneumonia, unspecified organism: J18.9

## 2018-12-23 HISTORY — PX: COLONOSCOPY WITH PROPOFOL: SHX5780

## 2018-12-23 SURGERY — COLONOSCOPY WITH PROPOFOL
Anesthesia: Monitor Anesthesia Care

## 2018-12-23 MED ORDER — ONDANSETRON HCL 4 MG/2ML IJ SOLN
INTRAMUSCULAR | Status: DC | PRN
  Administered 2018-12-23: 4 mg via INTRAVENOUS

## 2018-12-23 MED ORDER — PROPOFOL 500 MG/50ML IV EMUL
INTRAVENOUS | Status: DC | PRN
  Administered 2018-12-23: 09:00:00 via INTRAVENOUS
  Administered 2018-12-23: 100 ug/kg/min via INTRAVENOUS

## 2018-12-23 MED ORDER — PROMETHAZINE HCL 25 MG/ML IJ SOLN: 6.2500 mg | INTRAMUSCULAR | Status: DC | PRN

## 2018-12-23 MED ORDER — PROPOFOL 10 MG/ML IV BOLUS
INTRAVENOUS | Status: DC | PRN
  Administered 2018-12-23: 20 mg via INTRAVENOUS
  Administered 2018-12-23: 30 mg via INTRAVENOUS
  Administered 2018-12-23: 20 mg via INTRAVENOUS

## 2018-12-23 MED ORDER — SODIUM CHLORIDE 0.9 % IV SOLN: INTRAVENOUS | Status: DC

## 2018-12-23 MED ORDER — LACTATED RINGERS IV SOLN
INTRAVENOUS | Status: AC | PRN
  Administered 2018-12-23: 1000 mL via INTRAVENOUS
  Administered 2018-12-23: 09:00:00 via INTRAVENOUS

## 2018-12-23 MED ORDER — FENTANYL CITRATE (PF) 100 MCG/2ML IJ SOLN: 25.0000 ug | INTRAMUSCULAR | Status: DC | PRN

## 2018-12-23 SURGICAL SUPPLY — 22 items

## 2018-12-23 NOTE — Anesthesia Postprocedure Evaluation (Signed)
Anesthesia Post Note  Patient: Kathryn Church  Procedure(s) Performed: COLONOSCOPY WITH PROPOFOL (N/A ) ENDOSCOPIC MUCOSAL RESECTION (N/A ) SUBMUCOSAL LIFTING INJECTION HEMOSTASIS CLIP PLACEMENT FOREIGN BODY REMOVAL     Patient location during evaluation: PACU Anesthesia Type: MAC Level of consciousness: awake and alert Pain management: pain level controlled Vital Signs Assessment: post-procedure vital signs reviewed and stable Respiratory status: spontaneous breathing and respiratory function stable Cardiovascular status: stable Postop Assessment: no apparent nausea or vomiting Anesthetic complications: no    Last Vitals:  Vitals:   12/23/18 0950 12/23/18 1012  BP: 134/81 (!) 138/91  Pulse:  (!) 55  Resp:  11  Temp: 36.6 C 36.6 C  SpO2: 100% 100%    Last Pain:  Vitals:   12/23/18 1000  TempSrc:   PainSc: 0-No pain                 Janayia Burggraf DANIEL

## 2018-12-23 NOTE — Op Note (Signed)
[facility] Patient Name: Kathryn Church Procedure Date : 12/23/2018 MRN: [id number] Attending MD: [name] , MD Date of Birth: July 04, 1966 CSN: [id number] Age: 53 Admit Type: Outpatient Procedure:                Colonoscopy Indications:              Excision of colonic polyp Providers:                [name], MD, Carlyn Reichert, RN, Elspeth Cho Tech., Technician, Luciana Axe, CRNA Referring MD:             Gatha Mayer, MD Medicines:                Monitored Anesthesia Care Complications:            No immediate complications. Estimated Blood Loss:     Estimated blood loss: none. Estimated blood loss                            was minimal. Procedure:                Pre-Anesthesia Assessment:                           - Prior to the procedure, a History and Physical                            was performed, and patient medications and                            allergies were reviewed. The patient's tolerance of                            previous anesthesia was also reviewed. The risks                            and benefits of the procedure and the sedation                            options and risks were discussed with the patient.                            All questions were answered, and informed consent                            was obtained. Prior Anticoagulants: The patient has                            taken no previous anticoagulant or antiplatelet                            agents. ASA Grade Assessment: II - A patient with  mild systemic disease. After reviewing the risks                            and benefits, the patient was deemed in                            satisfactory condition to undergo the procedure.                           After obtaining informed consent, the colonoscope                            was passed under direct vision. Throughout the                             procedure, the patient's blood pressure, pulse, and                            oxygen saturations were monitored continuously. The                            PCF-H190DL ([id number] Olympus pediatric colonoscope                            was introduced through the anus and advanced to the                            the cecum, identified by appendiceal orifice and                            ileocecal valve. The colonoscopy was technically                            difficult and complex due to significant looping.                            Successful completion of the procedure was aided by                            changing the patient's position, withdrawing and                            reinserting the scope, straightening and shortening                            the scope to obtain bowel loop reduction and                            applying abdominal pressure. The patient tolerated                            the procedure. The quality of the bowel preparation  was excellent. The ileocecal valve, appendiceal                            orifice, and rectum were photographed. Scope In: 7:43:47 AM Scope Out: 9:21:03 AM Scope Withdrawal Time: 1 hour 30 minutes 54 seconds  Total Procedure Duration: 1 hour 37 minutes 16 seconds  Findings:      Skin tags were found on perianal exam.      Hemorrhoids were found on perianal exam.      The colon (entire examined portion) revealed significantly excessive       looping.      A 30 mm polyp was found in the cecum. The polyp was sessile.       Preparations were made for mucosal resection. [place] Orise Gel was       injected to raise the lesion. Piecemeal mucosal resection using a snare       was performed. Resection and retrieval were complete. Fulguration to       ablate the lesion's edge by snare tip was successful in effort to       decrease risk of recurrent polyp tissue. To close the defect after       mucosal  resection, seven hemostatic clips were successfully placed (MR       conditional). There was no bleeding during, or at the end, of the       procedure.      A localized area of moderately erythematous mucosa was found in the       recto-sigmoid colon - query due to looping of colon during the procedure       whether this is induced irritation as it was not seen on insertion.      The colon was otherwise grossly normal but indepth inspection was not       performed due to the intent of the procedure and recent full colonoscopy. Impression:               - Perianal skin tags and hemorrhoids found on                            perianal exam.                           - There was significant looping of the colon.                           - One 30-mm polyp in the cecum, removed with                            piecemeal mucosal resection. Resected and                            retrieved. Treated with a hot snare to edge to                            decrease risk of recurrence. Clips (MR conditional)                            were placed.                           -  Erythematous mucosa in the recto-sigmoid colon -                            likely irritation from looping. Recommendation:           - The patient will be observed post-procedure,                            until all discharge criteria are met.                           - Discharge patient to home.                           - Patient has a contact number available for                            emergencies. The signs and symptoms of potential                            delayed complications were discussed with the                            patient. Return to normal activities tomorrow.                            Written discharge instructions were provided to the                            patient.                           - Full liquid diet today.                           - Soft, low-residue/low-fiber diet starting                             tomorrow.                           - Patient should be excused from work that requires                            heavy lifting of greater than 10 pounds for the                            next 1-week. She can have her duties modified as                            such.                           - Continue present medications. Would try to  minimize NSAID use for 1-week to decrease risk of                            post-mucosectomy bleeding.                           - Await pathology results.                           - Repeat colonoscopy in 4-6 months for surveillance.                           - Monitor for signs/symptoms of                            bleeding/perforation.                           - The findings and recommendations were discussed                            with the patient.                           - The findings and recommendations were discussed                            with the patient's family.                           - The findings and recommendations were discussed                            with the referring physician. Procedure Code(s):        --- Professional ---                           [phone], Colonoscopy, flexible; with endoscopic                            mucosal resection Diagnosis Code(s):        --- Professional ---                           K64.9, Unspecified hemorrhoids                           D12.0, Benign neoplasm of cecum                           K63.89, Other specified diseases of intestine                           K64.4, Residual hemorrhoidal skin tags                           K63.5, Polyp of colon CPT copyright 2018 American Medical Association. All rights reserved. The codes  documented in this report are preliminary and upon coder review may  be revised to meet current compliance requirements. [name], MD 12/23/2018 9:40:21 AM Number of Addenda: 0

## 2018-12-23 NOTE — Discharge Instructions (Signed)
YOU HAD AN ENDOSCOPIC PROCEDURE TODAY: Refer to the procedure report and other information in the discharge instructions given to you for any specific questions about what was found during the examination. If this information does not answer your questions, please call [place] office at [phone] to clarify.  ° °YOU SHOULD EXPECT: Some feelings of bloating in the abdomen. Passage of more gas than usual. Walking can help get rid of the air that was put into your GI tract during the procedure and reduce the bloating. If you had a lower endoscopy (such as a colonoscopy or flexible sigmoidoscopy) you may notice spotting of blood in your stool or on the toilet paper. Some abdominal soreness may be present for a day or two, also. ° °DIET: Your first meal following the procedure should be a light meal and then it is ok to progress to your normal diet. A half-sandwich or bowl of soup is an example of a good first meal. Heavy or fried foods are harder to digest and may make you feel nauseous or bloated. Drink plenty of fluids but you should avoid alcoholic beverages for 24 hours. If you had a esophageal dilation, please see attached instructions for diet.   ° °ACTIVITY: Your care partner should take you home directly after the procedure. You should plan to take it easy, moving slowly for the rest of the day. You can resume normal activity the day after the procedure however YOU SHOULD NOT DRIVE, use power tools, machinery or perform tasks that involve climbing or major physical exertion for 24 hours (because of the sedation medicines used during the test).  ° °SYMPTOMS TO REPORT IMMEDIATELY: °A gastroenterologist can be reached at any hour. Please call [phone]  for any of the following symptoms:  °Following lower endoscopy (colonoscopy, flexible sigmoidoscopy) °Excessive amounts of blood in the stool  °Significant tenderness, worsening of abdominal pains  °Swelling of the abdomen that is new, acute  °Fever of 100° or  higher  °Following upper endoscopy (EGD, EUS, ERCP, esophageal dilation) °Vomiting of blood or coffee ground material  °New, significant abdominal pain  °New, significant chest pain or pain under the shoulder blades  °Painful or persistently difficult swallowing  °New shortness of breath  °Black, tarry-looking or red, bloody stools ° °FOLLOW UP:  °If any biopsies were taken you will be contacted by phone or by letter within the next 1-3 weeks. Call [phone]  if you have not heard about the biopsies in 3 weeks.  °Please also call with any specific questions about appointments or follow up tests. ° °

## 2018-12-23 NOTE — Anesthesia Procedure Notes (Signed)
Procedure Name: MAC Date/Time: 12/23/2018 7:37 AM Performed by: Jenne Campus, CRNA Pre-anesthesia Checklist: Patient identified, Emergency Drugs available, Suction available, Patient being monitored and Timeout performed Oxygen Delivery Method: Simple face mask

## 2018-12-23 NOTE — Transfer of Care (Signed)
Immediate Anesthesia Transfer of Care Note  Patient: Kathryn Church  Procedure(s) Performed: COLONOSCOPY WITH PROPOFOL (N/A ) ENDOSCOPIC MUCOSAL RESECTION (N/A ) SUBMUCOSAL LIFTING INJECTION HEMOSTASIS CLIP PLACEMENT FOREIGN BODY REMOVAL  Patient Location: PACU  Anesthesia Type:MAC  Level of Consciousness: oriented, drowsy and patient cooperative  Airway & Oxygen Therapy: Patient Spontanous Breathing and Patient connected to face mask oxygen  Post-op Assessment: Report given to RN and Post -op Vital signs reviewed and stable  Post vital signs: Reviewed  Last Vitals:  Vitals Value Taken Time  BP 163/100 12/23/2018  9:33 AM  Temp    Pulse 65 12/23/2018  9:35 AM  Resp 13 12/23/2018  9:35 AM  SpO2 100 % 12/23/2018  9:35 AM  Vitals shown include unvalidated device data.  Last Pain:  Vitals:   12/23/18 0934  TempSrc:   PainSc: (P) 0-No pain         Complications: No apparent anesthesia complications

## 2018-12-23 NOTE — H&P (Signed)
GASTROENTEROLOGY OUTPATIENT PROCEDURE H&P NOTE   Primary Care Physician: Patient, No Pcp Per  HPI: Kathryn Church is a 53 y.o. female who presents for Colonoscopy with attempt at EMR.  Past Medical History:  Diagnosis Date  . Colon polyps   . Depression   . Family history of adverse reaction to anesthesia    Pt mother has PONV  . Fibroid   . Heart murmur    as a child  . Pneumonia    as a child  . Wears glasses    Past Surgical History:  Procedure Laterality Date  . BREAST BIOPSY     benign  . LAPAROSCOPIC TOTAL HYSTERECTOMY  02/16/09   Robotic assisted  . NOVASURE ABLATION    . TUBAL LIGATION     Current Facility-Administered Medications  Medication Dose Route Frequency Provider Last Rate Last Dose  . 0.9 %  sodium chloride infusion   Intravenous Continuous Mansouraty, Telford Nab., MD      . lactated ringers infusion    Continuous PRN Mansouraty, Telford Nab., MD   1,000 mL at 12/23/18 0641   No Known Allergies Family History  Problem Relation Age of Onset  . Rheum arthritis Mother   . Hypertension Mother   . Alcoholism Father   . Stroke Maternal Grandmother        mini strokes  . Heart disease Maternal Grandfather        pacemaker  . Diabetes Maternal Grandfather   . Other Brother        vasculitis disease-unsure of all diag  . Heart disease Paternal Grandfather   . Leukemia Other   . Colon cancer Neg Hx   . Esophageal cancer Neg Hx   . Rectal cancer Neg Hx   . Stomach cancer Neg Hx   . Inflammatory bowel disease Neg Hx   . Liver disease Neg Hx   . Pancreatic cancer Neg Hx    Social History   Socioeconomic History  . Marital status: Single    Spouse name: Not on file  . Number of children: Not on file  . Years of education: Not on file  . Highest education level: Not on file  Occupational History  . Not on file  Social Needs  . Financial resource strain: Not on file  . Food insecurity:    Worry: Not on file    Inability: Not on file  .  Transportation needs:    Medical: Not on file    Non-medical: Not on file  Tobacco Use  . Smoking status: Current Every Day Smoker    Packs/day: 1.00    Types: Cigarettes  . Smokeless tobacco: Never Used  Substance and Sexual Activity  . Alcohol use: No  . Drug use: No  . Sexual activity: Not Currently    Partners: Male    Birth control/protection: Surgical    Comment: TLH  Lifestyle  . Physical activity:    Days per week: Not on file    Minutes per session: Not on file  . Stress: Not on file  Relationships  . Social connections:    Talks on phone: Not on file    Gets together: Not on file    Attends religious service: Not on file    Active member of club or organization: Not on file    Attends meetings of clubs or organizations: Not on file    Relationship status: Not on file  . Intimate partner violence:    Fear of  current or ex partner: Not on file    Emotionally abused: Not on file    Physically abused: Not on file    Forced sexual activity: Not on file  Other Topics Concern  . Not on file  Social History Narrative  . Not on file    Physical Exam: Vital signs in last 24 hours: Temp:  [97.9 F (36.6 C)] 97.9 F (36.6 C) (02/10 0635) Resp:  [13] 13 (02/10 0635) BP: (121)/(83) 121/83 (02/10 0635) SpO2:  [100 %] 100 % (02/10 0635) Weight:  [66.5 kg] 66.5 kg (02/10 0635)   GEN: NAD EYE: Sclerae anicteric ENT: MMM CV: RR without R/Gs  RESP: CTAB posteriorly GI: Soft, NT/ND NEURO:  Alert & Oriented x 3  Lab Results: No results for input(s): WBC, HGB, HCT, PLT in the last 72 hours. BMET No results for input(s): NA, K, CL, CO2, GLUCOSE, BUN, CREATININE, CALCIUM in the last 72 hours. LFT No results for input(s): PROT, ALBUMIN, AST, ALT, ALKPHOS, BILITOT, BILIDIR, IBILI in the last 72 hours. PT/INR No results for input(s): LABPROT, INR in the last 72 hours.   Impression / Plan: This is a 53 y.o.female who presents for Colonoscopy with attempt at  EMR.  The risks and benefits of endoscopic evaluation were discussed with the patient; these include but are not limited to the risk of perforation, infection, bleeding, missed lesions, lack of diagnosis, severe illness requiring hospitalization, as well as anesthesia and sedation related illnesses.  The patient is agreeable to proceed.    [name], MD [facility] Office # [phone]

## 2018-12-24 ENCOUNTER — Encounter (HOSPITAL_COMMUNITY): Payer: Self-pay | Admitting: Gastroenterology

## 2018-12-24 ENCOUNTER — Encounter: Payer: Self-pay | Admitting: Gastroenterology

## 2018-12-24 ENCOUNTER — Telehealth: Payer: Self-pay | Admitting: Gastroenterology

## 2018-12-24 NOTE — Telephone Encounter (Signed)
Kathryn Church, She should be on work restrictions such that she should not be lifting anyting more than 5-10 pounds for the next week.  She should be able to go back to work tomorrow. Thanks. GM

## 2018-12-24 NOTE — Telephone Encounter (Signed)
Pt had surgery yesterday 2.10.20 by Dr. Rush Landmark.  Pt needs a letter for work stating that her restrictions have been lifted and that she is able to work tomorrow. Please fax [phone].

## 2018-12-24 NOTE — Telephone Encounter (Signed)
Dr Rush Landmark can I write the letter for the pt?  Can she go back to normal activity tomorrow?

## 2018-12-25 NOTE — Telephone Encounter (Signed)
Pt call back and wanted to know if the letter will be sent to job by 4pm today because she has to be to work then.

## 2018-12-25 NOTE — Telephone Encounter (Signed)
Letter faxed and pt aware

## 2019-06-09 DIAGNOSIS — H35711 Central serous chorioretinopathy, right eye: Secondary | ICD-10-CM | POA: Diagnosis not present

## 2019-06-24 ENCOUNTER — Other Ambulatory Visit: Payer: Self-pay

## 2019-06-26 ENCOUNTER — Ambulatory Visit (INDEPENDENT_AMBULATORY_CARE_PROVIDER_SITE_OTHER): Payer: BC Managed Care – PPO | Admitting: Obstetrics and Gynecology

## 2019-06-26 ENCOUNTER — Encounter: Payer: Self-pay | Admitting: Obstetrics and Gynecology

## 2019-06-26 ENCOUNTER — Other Ambulatory Visit: Payer: Self-pay

## 2019-06-26 VITALS — BP 110/70 | HR 88 | Temp 98.1°F | Ht 66.0 in | Wt 141.0 lb

## 2019-06-26 DIAGNOSIS — Z01419 Encounter for gynecological examination (general) (routine) without abnormal findings: Secondary | ICD-10-CM

## 2019-06-26 DIAGNOSIS — Z0000 Encounter for general adult medical examination without abnormal findings: Secondary | ICD-10-CM | POA: Diagnosis not present

## 2019-06-26 DIAGNOSIS — F330 Major depressive disorder, recurrent, mild: Secondary | ICD-10-CM

## 2019-06-26 DIAGNOSIS — Z8639 Personal history of other endocrine, nutritional and metabolic disease: Secondary | ICD-10-CM

## 2019-06-26 DIAGNOSIS — E559 Vitamin D deficiency, unspecified: Secondary | ICD-10-CM | POA: Diagnosis not present

## 2019-06-26 MED ORDER — CITALOPRAM HYDROBROMIDE 20 MG PO TABS: ORAL_TABLET | ORAL | 1 refills | Status: DC

## 2019-06-26 NOTE — Progress Notes (Signed)
53 y.o. [id number] Single White or Caucasian Not Hispanic or Latino female here for annual exam.  H/O hysterectomy. She isn't currently sexually active. No STD concerns.  No bowel or bladder issues.   She has had issues with sleep, working shifts, doesn't like her job.  She is feeling down, may be depressed. Has to do everything by herself.  Doesn't like her job, doesn't sleep well secondary to her job. No real support group.    Patient's last menstrual period was 02/16/2009.          Sexually active: No.  The current method of family planning is status post hysterectomy.    Exercising: No.   Smoker:  yes  Health Maintenance: Pap:  2010 normal  History of abnormal Pap:  no MMG:  06/13/17 [place] Left BIRADS1:neg. F/u 1 year  Colonoscopy:  12/23/18 f/u 4-6 moths, large polyp removed.   BMD:   Never TDaP:  2014 Gardasil: n/a   reports that she has been smoking cigarettes. She has been smoking about 1.00 pack per day. She has never used smokeless tobacco. She reports that she does not drink alcohol or use drugs. 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 3 sons. 2 children are Engineer, structural, one is in jail. Kids are grown, 6 grandchildren, all local.  Past Medical History:  Diagnosis Date  . Colon polyps   . Depression   . Family history of adverse reaction to anesthesia    Pt mother has PONV  . Fibroid   . Heart murmur    as a child  . Pneumonia    as a child  . Wears glasses     Past Surgical History:  Procedure Laterality Date  . BREAST BIOPSY     benign  . COLONOSCOPY WITH PROPOFOL N/A 12/23/2018   Procedure: COLONOSCOPY WITH PROPOFOL;  Surgeon: Rush Landmark Telford Nab., MD;  Location: [place];  Service: Gastroenterology;  Laterality: N/A;  colonoscopy with EMR 90 minute case  . FOREIGN BODY REMOVAL  12/23/2018   Procedure: FOREIGN BODY REMOVAL;  Surgeon: Rush Landmark Telford Nab., MD;  Location: [place];  Service: Gastroenterology;;  . LAPAROSCOPIC TOTAL  HYSTERECTOMY  02/16/09   Robotic assisted  . NOVASURE ABLATION    . TUBAL LIGATION      Current Outpatient Medications  Medication Sig Dispense Refill  . Melatonin 1 MG TABS Take by mouth at bedtime.     No current facility-administered medications for this visit.     Family History  Problem Relation Age of Onset  . Rheum arthritis Mother   . Hypertension Mother   . Alcoholism Father   . Stroke Maternal Grandmother        mini strokes  . Heart disease Maternal Grandfather        pacemaker  . Diabetes Maternal Grandfather   . Other Brother        vasculitis disease-unsure of all diag  . Heart disease Paternal Grandfather   . Leukemia Other   . Colon cancer Neg Hx   . Esophageal cancer Neg Hx   . Rectal cancer Neg Hx   . Stomach cancer Neg Hx   . Inflammatory bowel disease Neg Hx   . Liver disease Neg Hx   . Pancreatic cancer Neg Hx     Review of Systems  Constitutional: Negative.   HENT: Negative.   Eyes: Negative.   Respiratory: Negative.   Cardiovascular: Negative.   Gastrointestinal: Negative.   Endocrine: Negative.   Genitourinary: Negative.  Musculoskeletal: Negative.   Skin: Negative.   Allergic/Immunologic: Negative.   Neurological: Negative.   Hematological: Negative.   Psychiatric/Behavioral: Negative.     Exam:   BP 110/70   Pulse 88   Temp 98.1 F (36.7 C) (Temporal)   Ht 5\' 6"  (1.676 m)   Wt 141 lb (64 kg)   LMP 02/16/2009   BMI 22.76 kg/m   Weight change: @WEIGHTCHANGE @ Height:   Height: 5\' 6"  (167.6 cm)  Ht Readings from Last 3 Encounters:  06/26/19 5\' 6"  (1.676 m)  12/23/18 5\' 6"  (1.676 m)  12/16/18 5\' 6"  (1.676 m)    General appearance: alert, cooperative and appears stated age Head: Normocephalic, without obvious abnormality, atraumatic Neck: no adenopathy, supple, symmetrical, trachea midline and thyroid normal to inspection and palpation Lungs: clear to auscultation bilaterally Cardiovascular: regular rate and rhythm Breasts:  normal appearance, no masses or tenderness Abdomen: soft, non-tender; non distended,  no masses,  no organomegaly Extremities: extremities normal, atraumatic, no cyanosis or edema Skin: Skin color, texture, turgor normal. No rashes or lesions Lymph nodes: Cervical, supraclavicular, and axillary nodes normal. No abnormal inguinal nodes palpated Neurologic: Grossly normal   Pelvic: External genitalia:  no lesions              Urethra:  normal appearing urethra with no masses, tenderness or lesions              Bartholins and Skenes: normal                 Vagina: atrophic appearing vagina with normal color and discharge, no lesions              Cervix: absent               Bimanual Exam:  Uterus:  uterus absent              Adnexa: no mass, fullness, tenderness               Rectovaginal: Confirms               Anus:  normal sphincter tone, no lesions  Chaperone was present for exam.  A:  Well Woman with normal exam  H/O elevated calcium  Diet deficient in vit d  Colon polyps  Mild depression, no SI  P:   No pap this year  Mammogram due  Repeat colonoscopy is due, she will schedule  Discussed breast self exam  Discussed calcium and vit D intake  Screening labs, vit d  Start on Celexa, f/u in one month  Declines counseling at this time

## 2019-06-26 NOTE — Progress Notes (Deleted)
53 y.o. G10P3000 Single White or Caucasian female here for annual exam.    Patient's last menstrual period was 02/16/2009.          Sexually active: {yes no:314532}  The current method of family planning is {contraception:315051}.    Exercising: {yes no:314532}  {types:19826} Smoker:  {YES NO:22349}  Health Maintenance: Pap:  *** History of abnormal Pap:  {YES NO:22349} MMG:  06/13/17 [place] Left BIRADS1:neg. F/u 1 year  Colonoscopy:  12/23/18  BMD:   *** TDaP:  *** Pneumonia vaccine(s):  *** Shingrix:   *** Hep C testing: *** Screening Labs: ***   reports that she has been smoking cigarettes. She has been smoking about 1.00 pack per day. She has never used smokeless tobacco. She reports that she does not drink alcohol or use drugs.  Past Medical History:  Diagnosis Date  . Colon polyps   . Depression   . Family history of adverse reaction to anesthesia    Pt mother has PONV  . Fibroid   . Heart murmur    as a child  . Pneumonia    as a child  . Wears glasses     Past Surgical History:  Procedure Laterality Date  . BREAST BIOPSY     benign  . COLONOSCOPY WITH PROPOFOL N/A 12/23/2018   Procedure: COLONOSCOPY WITH PROPOFOL;  Surgeon: Rush Landmark Telford Nab., MD;  Location: [facility];  Service: Gastroenterology;  Laterality: N/A;  colonoscopy with EMR 90 minute case  . FOREIGN BODY REMOVAL  12/23/2018   Procedure: FOREIGN BODY REMOVAL;  Surgeon: Rush Landmark Telford Nab., MD;  Location: [facility];  Service: Gastroenterology;;  . LAPAROSCOPIC TOTAL HYSTERECTOMY  02/16/09   Robotic assisted  . NOVASURE ABLATION    . TUBAL LIGATION      No current outpatient medications on file.   No current facility-administered medications for this visit.     Family History  Problem Relation Age of Onset  . Rheum arthritis Mother   . Hypertension Mother   . Alcoholism Father   . Stroke Maternal Grandmother        mini strokes  . Heart disease Maternal Grandfather        pacemaker   . Diabetes Maternal Grandfather   . Other Brother        vasculitis disease-unsure of all diag  . Heart disease Paternal Grandfather   . Leukemia Other   . Colon cancer Neg Hx   . Esophageal cancer Neg Hx   . Rectal cancer Neg Hx   . Stomach cancer Neg Hx   . Inflammatory bowel disease Neg Hx   . Liver disease Neg Hx   . Pancreatic cancer Neg Hx     Review of Systems  Exam:   LMP 02/16/2009   Height:      Ht Readings from Last 3 Encounters:  12/23/18 5\' 6"  (1.676 m)  12/16/18 5\' 6"  (1.676 m)  11/15/18 5' 6.14" (1.68 m)    General appearance: alert, cooperative and appears stated age Head: Normocephalic, without obvious abnormality, atraumatic Neck: no adenopathy, supple, symmetrical, trachea midline and thyroid {EXAM; THYROID:18604} Lungs: clear to auscultation bilaterally Breasts: {Exam; breast:13139::"normal appearance, no masses or tenderness"} Heart: regular rate and rhythm Abdomen: soft, non-tender; bowel sounds normal; no masses,  no organomegaly Extremities: extremities normal, atraumatic, no cyanosis or edema Skin: Skin color, texture, turgor normal. No rashes or lesions Lymph nodes: Cervical, supraclavicular, and axillary nodes normal. No abnormal inguinal nodes palpated Neurologic: Grossly normal   Pelvic:  External genitalia:  no lesions              Urethra:  normal appearing urethra with no masses, tenderness or lesions              Bartholins and Skenes: normal                 Vagina: normal appearing vagina with normal color and discharge, no lesions              Cervix: {exam; cervix:14595}              Pap taken: {yes no:314532} Bimanual Exam:  Uterus:  {exam; uterus:12215}              Adnexa: {exam; adnexa:12223}               Rectovaginal: Confirms               Anus:  normal sphincter tone, no lesions  Chaperone was present for exam.  A:  Well Woman with normal exam  P:   {plan; gyn:5269::"mammogram","pap smear","return annually or  prn"}

## 2019-06-26 NOTE — Patient Instructions (Signed)
EXERCISE AND DIET:  We recommended that you start or continue a regular exercise program for good health. Regular exercise means any activity that makes your heart beat faster and makes you sweat.  We recommend exercising at least 30 minutes per day at least 3 days a week, preferably 4 or 5.  We also recommend a diet low in fat and sugar.  Inactivity, poor dietary choices and obesity can cause diabetes, heart attack, stroke, and kidney damage, among others.    ALCOHOL AND SMOKING:  Women should limit their alcohol intake to no more than 7 drinks/beers/glasses of wine (combined, not each!) per week. Moderation of alcohol intake to this level decreases your risk of breast cancer and liver damage. And of course, no recreational drugs are part of a healthy lifestyle.  And absolutely no smoking or even second hand smoke. Most people know smoking can cause heart and lung diseases, but did you know it also contributes to weakening of your bones? Aging of your skin?  Yellowing of your teeth and nails?  CALCIUM AND VITAMIN D:  Adequate intake of calcium and Vitamin D are recommended.  The recommendations for exact amounts of these supplements seem to change often, but generally speaking 1,200 mg of calcium (between diet and supplement) and 800 units of Vitamin D per day seems prudent. Certain women may benefit from higher intake of Vitamin D.  If you are among these women, your doctor will have told you during your visit.    PAP SMEARS:  Pap smears, to check for cervical cancer or precancers,  have traditionally been done yearly, although recent scientific advances have shown that most women can have pap smears less often.  However, every woman still should have a physical exam from her gynecologist every year. It will include a breast check, inspection of the vulva and vagina to check for abnormal growths or skin changes, a visual exam of the cervix, and then an exam to evaluate the size and shape of the uterus and  ovaries.  And after 55 years of age, a rectal exam is indicated to check for rectal cancers. We will also provide age appropriate advice regarding health maintenance, like when you should have certain vaccines, screening for sexually transmitted diseases, bone density testing, colonoscopy, mammograms, etc.   MAMMOGRAMS:  All women over 40 years old should have a yearly mammogram. Many facilities now offer a "3D" mammogram, which may cost around $50 extra out of pocket. If possible,  we recommend you accept the option to have the 3D mammogram performed.  It both reduces the number of women who will be called back for extra views which then turn out to be normal, and it is better than the routine mammogram at detecting truly abnormal areas.    COLON CANCER SCREENING: Now recommend starting at age 45. At this time colonoscopy is not covered for routine screening until 50. There are take home tests that can be done between 45-49.   COLONOSCOPY:  Colonoscopy to screen for colon cancer is recommended for all women at age 50.  We know, you hate the idea of the prep.  We agree, BUT, having colon cancer and not knowing it is worse!!  Colon cancer so often starts as a polyp that can be seen and removed at colonscopy, which can quite literally save your life!  And if your first colonoscopy is normal and you have no family history of colon cancer, most women don't have to have it again for   10 years.  Once every ten years, you can do something that may end up saving your life, right?  We will be happy to help you get it scheduled when you are ready.  Be sure to check your insurance coverage so you understand how much it will cost.  It may be covered as a preventative service at no cost, but you should check your particular policy.      Breast Self-Awareness Breast self-awareness means being familiar with how your breasts look and feel. It involves checking your breasts regularly and reporting any changes to your  health care provider. Practicing breast self-awareness is important. A change in your breasts can be a sign of a serious medical problem. Being familiar with how your breasts look and feel allows you to find any problems early, when treatment is more likely to be successful. All women should practice breast self-awareness, including women who have had breast implants. How to do a breast self-exam One way to learn what is normal for your breasts and whether your breasts are changing is to do a breast self-exam. To do a breast self-exam: Look for Changes  1. Remove all the clothing above your waist. 2. Stand in front of a mirror in a room with good lighting. 3. Put your hands on your hips. 4. Push your hands firmly downward. 5. Compare your breasts in the mirror. Look for differences between them (asymmetry), such as: ? Differences in shape. ? Differences in size. ? Puckers, dips, and bumps in one breast and not the other. 6. Look at each breast for changes in your skin, such as: ? Redness. ? Scaly areas. 7. Look for changes in your nipples, such as: ? Discharge. ? Bleeding. ? Dimpling. ? Redness. ? A change in position. Feel for Changes Carefully feel your breasts for lumps and changes. It is best to do this while lying on your back on the floor and again while sitting or standing in the shower or tub with soapy water on your skin. Feel each breast in the following way:  Place the arm on the side of the breast you are examining above your head.  Feel your breast with the other hand.  Start in the nipple area and make  inch (2 cm) overlapping circles to feel your breast. Use the pads of your three middle fingers to do this. Apply light pressure, then medium pressure, then firm pressure. The light pressure will allow you to feel the tissue closest to the skin. The medium pressure will allow you to feel the tissue that is a little deeper. The firm pressure will allow you to feel the tissue  close to the ribs.  Continue the overlapping circles, moving downward over the breast until you feel your ribs below your breast.  Move one finger-width toward the center of the body. Continue to use the  inch (2 cm) overlapping circles to feel your breast as you move slowly up toward your collarbone.  Continue the up and down exam using all three pressures until you reach your armpit.  Write Down What You Find  Write down what is normal for each breast and any changes that you find. Keep a written record with breast changes or normal findings for each breast. By writing this information down, you do not need to depend only on memory for size, tenderness, or location. Write down where you are in your menstrual cycle, if you are still menstruating. If you are having trouble noticing differences   in your breasts, do not get discouraged. With time you will become more familiar with the variations in your breasts and more comfortable with the exam. How often should I examine my breasts? Examine your breasts every month. If you are breastfeeding, the best time to examine your breasts is after a feeding or after using a breast pump. If you menstruate, the best time to examine your breasts is 5-7 days after your period is over. During your period, your breasts are lumpier, and it may be more difficult to notice changes. When should I see my health care provider? See your health care provider if you notice:  A change in shape or size of your breasts or nipples.  A change in the skin of your breast or nipples, such as a reddened or scaly area.  Unusual discharge from your nipples.  A lump or thick area that was not there before.  Pain in your breasts.  Anything that concerns you.  Living With Depression Everyone experiences occasional disappointment, sadness, and loss in their lives. When you are feeling down, blue, or sad for at least 2 weeks in a row, it may mean that you have depression.  Depression can affect your thoughts and feelings, relationships, daily activities, and physical health. It is caused by changes in the way your brain functions. If you receive a diagnosis of depression, your health care provider will tell you which type of depression you have and what treatment options are available to you. If you are living with depression, there are ways to help you recover from it and also ways to prevent it from coming back. How to cope with lifestyle changes Coping with stress     Stress is your body's reaction to life changes and events, both good and bad. Stressful situations may include:  Getting married.  The death of a spouse.  Losing a job.  Retiring.  Having a baby. Stress can last just a few hours or it can be ongoing. Stress can play a major role in depression, so it is important to learn both how to cope with stress and how to think about it differently. Talk with your health care provider or a counselor if you would like to learn more about stress reduction. He or she may suggest some stress reduction techniques, such as:  Music therapy. This can include creating music or listening to music. Choose music that you enjoy and that inspires you.  Mindfulness-based meditation. This kind of meditation can be done while sitting or walking. It involves being aware of your normal breaths, rather than trying to control your breathing.  Centering prayer. This is a kind of meditation that involves focusing on a spiritual word or phrase. Choose a word, phrase, or sacred image that is meaningful to you and that brings you peace.  Deep breathing. To do this, expand your stomach and inhale slowly through your nose. Hold your breath for 3-5 seconds, then exhale slowly, allowing your stomach muscles to relax.  Muscle relaxation. This involves intentionally tensing muscles then relaxing them. Choose a stress reduction technique that fits your lifestyle and personality.  Stress reduction techniques take time and practice to develop. Set aside 5-15 minutes a day to do them. Therapists can offer training in these techniques. The training may be covered by some insurance plans. Other things you can do to manage stress include:  Keeping a stress diary. This can help you learn what triggers your stress and ways to control your response.  Understanding what your limits are and saying no to requests or events that lead to a schedule that is too full.  Thinking about how you respond to certain situations. You may not be able to control everything, but you can control how you react.  Adding humor to your life by watching funny films or TV shows.  Making time for activities that help you relax and not feeling guilty about spending your time this way.  Medicines Your health care provider may suggest certain medicines if he or she feels that they will help improve your condition. Avoid using alcohol and other substances that may prevent your medicines from working properly (may interact). It is also important to:  Talk with your pharmacist or health care provider about all the medicines that you take, their possible side effects, and what medicines are safe to take together.  Make it your goal to take part in all treatment decisions (shared decision-making). This includes giving input on the side effects of medicines. It is best if shared decision-making with your health care provider is part of your total treatment plan. If your health care provider prescribes a medicine, you may not notice the full benefits of it for 4-8 weeks. Most people who are treated for depression need to be on medicine for at least 6-12 months after they feel better. If you are taking medicines as part of your treatment, do not stop taking medicines without first talking to your health care provider. You may need to have the medicine slowly decreased (tapered) over time to decrease the risk of harmful  side effects. Relationships Your health care provider may suggest family therapy along with individual therapy and drug therapy. While there may not be family problems that are causing you to feel depressed, it is still important to make sure your family learns as much as they can about your mental health. Having your family's support can help make your treatment successful. How to recognize changes in your condition Everyone has a different response to treatment for depression. Recovery from major depression happens when you have not had signs of major depression for two months. This may mean that you will start to:  Have more interest in doing activities.  Feel less hopeless than you did 2 months ago.  Have more energy.  Overeat less often, or have better or improving appetite.  Have better concentration. Your health care provider will work with you to decide the next steps in your recovery. It is also important to recognize when your condition is getting worse. Watch for these signs:  Having fatigue or low energy.  Eating too much or too little.  Sleeping too much or too little.  Feeling restless, agitated, or hopeless.  Having trouble concentrating or making decisions.  Having unexplained physical complaints.  Feeling irritable, angry, or aggressive. Get help as soon as you or your family members notice these symptoms coming back. How to get support and help from others How to talk with friends and family members about your condition  Talking to friends and family members about your condition can provide you with one way to get support and guidance. Reach out to trusted friends or family members, explain your symptoms to them, and let them know that you are working with a health care provider to treat your depression. Financial resources Not all insurance plans cover mental health care, so it is important to check with your insurance carrier. If paying for co-pays or  counseling services is  a problem, search for a local or county mental health care center. They may be able to offer public mental health care services at low or no cost when you are not able to see a private health care provider. If you are taking medicine for depression, you may be able to get the generic form, which may be less expensive. Some makers of prescription medicines also offer help to patients who cannot afford the medicines they need. Follow these instructions at home:   Get the right amount and quality of sleep.  Cut down on using caffeine, tobacco, alcohol, and other potentially harmful substances.  Try to exercise, such as walking or lifting small weights.  Take over-the-counter and prescription medicines only as told by your health care provider.  Eat a healthy diet that includes plenty of vegetables, fruits, whole grains, low-fat dairy products, and lean protein. Do not eat a lot of foods that are high in solid fats, added sugars, or salt.  Keep all follow-up visits as told by your health care provider. This is important. Contact a health care provider if:  You stop taking your antidepressant medicines, and you have any of these symptoms: ? Nausea. ? Headache. ? Feeling lightheaded. ? Chills and body aches. ? Not being able to sleep (insomnia).  You or your friends and family think your depression is getting worse. Get help right away if:  You have thoughts of hurting yourself or others. If you ever feel like you may hurt yourself or others, or have thoughts about taking your own life, get help right away. You can go to your nearest emergency department or call:  Your local emergency services (911 in the [place]  A suicide crisis helpline, such as the [facility] at 504 364 2448. This is open 24-hours a day. Summary  If you are living with depression, there are ways to help you recover from it and also ways to prevent it from coming  back.  Work with your health care team to create a management plan that includes counseling, stress management techniques, and healthy lifestyle habits. This information is not intended to replace advice given to you by your health care provider. Make sure you discuss any questions you have with your health care provider. Document Released: 10/02/2016 Document Revised: 02/21/2019 Document Reviewed: 10/02/2016 Elsevier Patient Education  2020 [employer].

## 2019-06-27 ENCOUNTER — Telehealth: Payer: Self-pay | Admitting: Gastroenterology

## 2019-06-27 ENCOUNTER — Other Ambulatory Visit: Payer: Self-pay | Admitting: *Deleted

## 2019-06-27 DIAGNOSIS — E349 Endocrine disorder, unspecified: Secondary | ICD-10-CM

## 2019-06-27 LAB — COMPREHENSIVE METABOLIC PANEL
ALT: 12 IU/L (ref 0–32)
AST: 18 IU/L (ref 0–40)
Albumin/Globulin Ratio: 2.2 (ref 1.2–2.2)
Albumin: 4.4 g/dL (ref 3.8–4.9)
Alkaline Phosphatase: 89 IU/L (ref 39–117)
BUN/Creatinine Ratio: 13 (ref 9–23)
BUN: 11 mg/dL (ref 6–24)
Bilirubin Total: 0.4 mg/dL (ref 0.0–1.2)
CO2: 24 mmol/L (ref 20–29)
Calcium: 10.4 mg/dL — ABNORMAL HIGH (ref 8.7–10.2)
Chloride: 104 mmol/L (ref 96–106)
Creatinine, Ser: 0.83 mg/dL (ref 0.57–1.00)
GFR calc Af Amer: 92 mL/min/{1.73_m2} (ref >59)
GFR calc non Af Amer: 80 mL/min/{1.73_m2} (ref >59)
Globulin, Total: 2 g/dL (ref 1.5–4.5)
Glucose: 85 mg/dL (ref 65–99)
Potassium: 3.9 mmol/L (ref 3.5–5.2)
Sodium: 141 mmol/L (ref 134–144)
Total Protein: 6.4 g/dL (ref 6.0–8.5)

## 2019-06-27 LAB — LIPID PANEL
Chol/HDL Ratio: 5 ratio — ABNORMAL HIGH (ref 0.0–4.4)
Cholesterol, Total: 189 mg/dL (ref 100–199)
HDL: 38 mg/dL — ABNORMAL LOW (ref >39)
LDL Calculated: 132 mg/dL — ABNORMAL HIGH (ref 0–99)
Triglycerides: 93 mg/dL (ref 0–149)
VLDL Cholesterol Cal: 19 mg/dL (ref 5–40)

## 2019-06-27 LAB — CBC
Hematocrit: 38.3 % (ref 34.0–46.6)
Hemoglobin: 12.6 g/dL (ref 11.1–15.9)
MCH: 31.8 pg (ref 26.6–33.0)
MCHC: 32.9 g/dL (ref 31.5–35.7)
MCV: 97 fL (ref 79–97)
Platelets: 315 10*3/uL (ref 150–450)
RBC: 3.96 x10E6/uL (ref 3.77–5.28)
RDW: 12.1 % (ref 11.7–15.4)
WBC: 5.6 10*3/uL (ref 3.4–10.8)

## 2019-06-27 LAB — VITAMIN D 25 HYDROXY (VIT D DEFICIENCY, FRACTURES): Vit D, 25-Hydroxy: 36.1 ng/mL (ref 30.0–100.0)

## 2019-06-30 NOTE — Telephone Encounter (Signed)
Sir please advise who is to do the next colonoscopy, you or Dr Rush Landmark? He did the last one in February, please see his letter to patient. Thank you.

## 2019-06-30 NOTE — Telephone Encounter (Signed)
Glendell Docker and Precious Bard, Thanks for sending this out. Patty, can you get this patient in for Colonoscopy with EMR slot in the next 6-8 weeks for follow up of her February EMR.  Please place on notation for [facility] availability. Thanks. GM

## 2019-06-30 NOTE — Telephone Encounter (Signed)
This would be for Dr. Rush Landmark to do I think but I need his input Tell her he and I will discuss and come up with a plan He is away so when he returns

## 2019-07-01 NOTE — Telephone Encounter (Signed)
Staff message to call pt in 6-8 weeks to set appt up for procedure

## 2019-07-02 ENCOUNTER — Other Ambulatory Visit: Payer: BC Managed Care – PPO

## 2019-07-03 NOTE — Telephone Encounter (Signed)
Patient is calling back regarding this. She is wanting to schedule

## 2019-07-04 NOTE — Telephone Encounter (Signed)
The pt has been advised that she will be contacted closet to time to get set up for procedure.

## 2019-07-24 ENCOUNTER — Telehealth: Payer: BC Managed Care – PPO | Admitting: Obstetrics and Gynecology

## 2019-07-24 NOTE — Progress Notes (Signed)
I was unable to connect and was unable to reach the patient by phone.

## 2019-08-12 ENCOUNTER — Telehealth: Payer: Self-pay

## 2019-08-12 ENCOUNTER — Other Ambulatory Visit: Payer: Self-pay

## 2019-08-12 DIAGNOSIS — D126 Benign neoplasm of colon, unspecified: Secondary | ICD-10-CM

## 2019-08-12 MED ORDER — PEG 3350-KCL-NA BICARB-NACL 420 G PO SOLR: 4000.0000 mL | Freq: Once | ORAL | 0 refills | Status: AC

## 2019-08-12 NOTE — Telephone Encounter (Signed)
The pt has been scheduled for 09/17/19 at 9 am at [facility] with Dr Rush Landmark .  Covid testing on 10/31 at 810 am.    The pt has been instructed for her colon and COVID testing.  Prep prescription has been sent to the pharmacy

## 2019-08-12 NOTE — Telephone Encounter (Signed)
-----   Message from Timothy Lasso, RN sent at 07/01/2019 10:16 AM EDT ----- Benito Mccreedy, Thanks for sending this out. Saroya Riccobono, can you get this patient in for Colonoscopy with EMR slot in the next 6-8 weeks for follow up of her February EMR.  Please place on notation for Endor[facility] availability. Thanks. GM

## 2019-08-25 ENCOUNTER — Telehealth: Payer: Self-pay | Admitting: Gastroenterology

## 2019-08-25 MED ORDER — PEG 3350-KCL-NA BICARB-NACL 420 G PO SOLR: 4000.0000 mL | Freq: Once | ORAL | 0 refills | Status: AC

## 2019-08-25 NOTE — Telephone Encounter (Signed)
Sent TO  [facility] AS PER PT'S REQUEST

## 2019-08-25 NOTE — Telephone Encounter (Signed)
I apologize but I forgot to mention that [facility] is located in [place].

## 2019-08-25 NOTE — Telephone Encounter (Signed)
Pt would like prescription for prep sent to [place], She stated that [facility] is having troubles with prescriptions.

## 2019-08-28 ENCOUNTER — Other Ambulatory Visit (HOSPITAL_COMMUNITY): Payer: BLUE CROSS/BLUE SHIELD

## 2019-09-13 ENCOUNTER — Other Ambulatory Visit (HOSPITAL_COMMUNITY)
Admission: RE | Admit: 2019-09-13 | Discharge: 2019-09-13 | Disposition: A | Discharge status: Home / Self Care | Payer: BC Managed Care – PPO | Source: Ambulatory Visit | Attending: Gastroenterology | Admitting: Gastroenterology

## 2019-09-13 ENCOUNTER — Other Ambulatory Visit (HOSPITAL_COMMUNITY): Payer: Self-pay | Admitting: Cardiology

## 2019-09-13 DIAGNOSIS — Z01812 Encounter for preprocedural laboratory examination: Secondary | ICD-10-CM | POA: Insufficient documentation

## 2019-09-13 DIAGNOSIS — Z20828 Contact with and (suspected) exposure to other viral communicable diseases: Secondary | ICD-10-CM | POA: Insufficient documentation

## 2019-09-14 LAB — NOVEL CORONAVIRUS, NAA (HOSP ORDER, SEND-OUT TO REF LAB; TAT 18-24 HRS): SARS-CoV-2, NAA: NOT DETECTED

## 2019-09-16 ENCOUNTER — Encounter (HOSPITAL_COMMUNITY): Payer: Self-pay | Admitting: *Deleted

## 2019-09-16 ENCOUNTER — Other Ambulatory Visit: Payer: Self-pay

## 2019-09-16 DIAGNOSIS — H35711 Central serous chorioretinopathy, right eye: Secondary | ICD-10-CM | POA: Diagnosis not present

## 2019-09-16 DIAGNOSIS — D3132 Benign neoplasm of left choroid: Secondary | ICD-10-CM | POA: Diagnosis not present

## 2019-09-16 DIAGNOSIS — H3581 Retinal edema: Secondary | ICD-10-CM | POA: Diagnosis not present

## 2019-09-16 DIAGNOSIS — H3540 Unspecified peripheral retinal degeneration: Secondary | ICD-10-CM | POA: Diagnosis not present

## 2019-09-16 NOTE — Progress Notes (Signed)
Pt denies SOB, chest pain, and being under the care of a cardiologist. Pt denies having a stress test , echo and cardiac cath. Pt denies having an EKG and chest x ray within the last year. Pt denies recent labs. Pt made aware to stop taking Aspirin ( unless advised otherwise by surgeon), vitamins, fish oil (Lovaza) and herbal medications. Do not take any NSAIDs ie: Ibuprofen, Advil, Naproxen (Aleve), Motrin, BC and Goody Powder. Pt verbalized understanding of all pre-op instructions.

## 2019-09-16 NOTE — Anesthesia Preprocedure Evaluation (Addendum)
Anesthesia Evaluation  Patient identified by MRN, date of birth, ID band Patient awake    Reviewed: Allergy & Precautions, NPO status , Patient's Chart, lab work & pertinent test results  Airway Mallampati: II  TM Distance: >3 FB Neck ROM: Full    Dental   Pulmonary Current Smoker and Patient abstained from smoking.,    breath sounds clear to auscultation       Cardiovascular negative cardio ROS   Rhythm:Regular Rate:Normal     Neuro/Psych negative neurological ROS     GI/Hepatic negative GI ROS, Neg liver ROS,   Endo/Other  negative endocrine ROS  Renal/GU negative Renal ROS     Musculoskeletal   Abdominal   Peds  Hematology negative hematology ROS (+)   Anesthesia Other Findings   Reproductive/Obstetrics                            Anesthesia Physical Anesthesia Plan  ASA: III  Anesthesia Plan: MAC   Post-op Pain Management:    Induction: Intravenous  PONV Risk Score and Plan: 1 and Propofol infusion and Treatment may vary due to age or medical condition  Airway Management Planned: Natural Airway and Simple Face Mask  Additional Equipment:   Intra-op Plan:   Post-operative Plan:   Informed Consent: I have reviewed the patients History and Physical, chart, labs and discussed the procedure including the risks, benefits and alternatives for the proposed anesthesia with the patient or authorized representative who has indicated his/her understanding and acceptance.       Plan Discussed with: CRNA  Anesthesia Plan Comments:        Anesthesia Quick Evaluation

## 2019-09-17 ENCOUNTER — Ambulatory Visit (HOSPITAL_COMMUNITY)
Admission: RE | Admit: 2019-09-17 | Discharge: 2019-09-17 | Disposition: A | Discharge status: Home / Self Care | Payer: BC Managed Care – PPO | Attending: Gastroenterology | Admitting: Gastroenterology

## 2019-09-17 ENCOUNTER — Ambulatory Visit (HOSPITAL_COMMUNITY): Payer: BC Managed Care – PPO | Admitting: Anesthesiology

## 2019-09-17 ENCOUNTER — Encounter (HOSPITAL_COMMUNITY)
Admission: RE | Disposition: A | Discharge status: Home / Self Care | Payer: Self-pay | Source: Home / Self Care | Attending: Gastroenterology

## 2019-09-17 ENCOUNTER — Encounter (HOSPITAL_COMMUNITY): Payer: Self-pay | Admitting: *Deleted

## 2019-09-17 DIAGNOSIS — D124 Benign neoplasm of descending colon: Secondary | ICD-10-CM | POA: Insufficient documentation

## 2019-09-17 DIAGNOSIS — Z79899 Other long term (current) drug therapy: Secondary | ICD-10-CM | POA: Insufficient documentation

## 2019-09-17 DIAGNOSIS — Q438 Other specified congenital malformations of intestine: Secondary | ICD-10-CM | POA: Diagnosis not present

## 2019-09-17 DIAGNOSIS — K621 Rectal polyp: Secondary | ICD-10-CM | POA: Diagnosis not present

## 2019-09-17 DIAGNOSIS — K641 Second degree hemorrhoids: Secondary | ICD-10-CM | POA: Insufficient documentation

## 2019-09-17 DIAGNOSIS — F17210 Nicotine dependence, cigarettes, uncomplicated: Secondary | ICD-10-CM | POA: Diagnosis not present

## 2019-09-17 DIAGNOSIS — Z1211 Encounter for screening for malignant neoplasm of colon: Secondary | ICD-10-CM | POA: Diagnosis not present

## 2019-09-17 DIAGNOSIS — K635 Polyp of colon: Secondary | ICD-10-CM

## 2019-09-17 DIAGNOSIS — Z09 Encounter for follow-up examination after completed treatment for conditions other than malignant neoplasm: Secondary | ICD-10-CM | POA: Diagnosis not present

## 2019-09-17 DIAGNOSIS — D125 Benign neoplasm of sigmoid colon: Secondary | ICD-10-CM | POA: Diagnosis not present

## 2019-09-17 DIAGNOSIS — D126 Benign neoplasm of colon, unspecified: Secondary | ICD-10-CM

## 2019-09-17 DIAGNOSIS — Z86010 Personal history of colonic polyps: Secondary | ICD-10-CM | POA: Diagnosis not present

## 2019-09-17 HISTORY — PX: POLYPECTOMY: SHX5525

## 2019-09-17 HISTORY — PX: SUBMUCOSAL LIFTING INJECTION: SHX6855

## 2019-09-17 HISTORY — PX: ENDOSCOPIC MUCOSAL RESECTION: SHX6839

## 2019-09-17 HISTORY — PX: COLONOSCOPY: SHX5424

## 2019-09-17 SURGERY — COLONOSCOPY
Anesthesia: Monitor Anesthesia Care

## 2019-09-17 MED ORDER — SODIUM CHLORIDE 0.9 % IV SOLN: INTRAVENOUS | Status: DC

## 2019-09-17 MED ORDER — LACTATED RINGERS IV SOLN
INTRAVENOUS | Status: DC
  Administered 2019-09-17: 08:00:00 via INTRAVENOUS

## 2019-09-17 MED ORDER — LACTATED RINGERS IV SOLN
INTRAVENOUS | Status: DC | PRN
  Administered 2019-09-17: 08:00:00 via INTRAVENOUS

## 2019-09-17 MED ORDER — EPHEDRINE SULFATE-NACL 50-0.9 MG/10ML-% IV SOSY
PREFILLED_SYRINGE | INTRAVENOUS | Status: DC | PRN
  Administered 2019-09-17: 5 mg via INTRAVENOUS

## 2019-09-17 MED ORDER — PROPOFOL 10 MG/ML IV BOLUS
INTRAVENOUS | Status: DC | PRN
  Administered 2019-09-17: 70 mg via INTRAVENOUS
  Administered 2019-09-17 (×2): 50 mg via INTRAVENOUS
  Administered 2019-09-17: 80 mg via INTRAVENOUS
  Administered 2019-09-17: 50 mg via INTRAVENOUS

## 2019-09-17 MED ORDER — PROPOFOL 500 MG/50ML IV EMUL
INTRAVENOUS | Status: DC | PRN
  Administered 2019-09-17: 100 ug/kg/min via INTRAVENOUS

## 2019-09-17 MED ORDER — LIDOCAINE 2% (20 MG/ML) 5 ML SYRINGE
INTRAMUSCULAR | Status: DC | PRN
  Administered 2019-09-17: 50 mg via INTRAVENOUS

## 2019-09-17 NOTE — Transfer of Care (Signed)
Immediate Anesthesia Transfer of Care Note  Patient: Kathryn Church  Procedure(s) Performed: COLONOSCOPY (N/A ) POLYPECTOMY ENDOSCOPIC MUCOSAL RESECTION SUBMUCOSAL LIFTING INJECTION  Patient Location: Endoscopy Unit  Anesthesia Type:MAC  Level of Consciousness: drowsy and patient cooperative  Airway & Oxygen Therapy: Patient Spontanous Breathing and Patient connected to face mask oxygen  Post-op Assessment: Report given to RN and Post -op Vital signs reviewed and stable  Post vital signs: Reviewed and stable  Last Vitals:  Vitals Value Taken Time  BP    Temp 36.3 C 09/17/19 0916  Pulse    Resp    SpO2      Last Pain:  Vitals:   09/17/19 0916  TempSrc: Temporal  PainSc:          Complications: No apparent anesthesia complications

## 2019-09-17 NOTE — Op Note (Signed)
[facility] Patient Name: Kathryn Church Procedure Date : 09/17/2019 MRN: [id number] Attending MD: [name] , MD Date of Birth: [birth year] CSN: [id number] Age: 53 Admit Type: Outpatient Procedure:                Colonoscopy Indications:              High risk colon cancer surveillance: Personal                            history of sessile serrated colon polyp (10 mm or                            greater in size) Providers:                [name], MD, Carlyn Reichert, RN, Ladona Ridgel, Technician, Vania Rea, CRNA Referring MD:             Gatha Mayer, MD Medicines:                Monitored Anesthesia Care Complications:            No immediate complications. Estimated Blood Loss:     Estimated blood loss was minimal. Procedure:                Pre-Anesthesia Assessment:                           - Prior to the procedure, a History and Physical                            was performed, and patient medications and                            allergies were reviewed. The patient's tolerance of                            previous anesthesia was also reviewed. The risks                            and benefits of the procedure and the sedation                            options and risks were discussed with the patient.                            All questions were answered, and informed consent                            was obtained. Prior Anticoagulants: The patient has                            taken no previous anticoagulant or antiplatelet  agents. ASA Grade Assessment: II - A patient with                            mild systemic disease. After reviewing the risks                            and benefits, the patient was deemed in                            satisfactory condition to undergo the procedure.                           After obtaining informed consent, the colonoscope         was passed under direct vision. Throughout the                            procedure, the patient's blood pressure, pulse, and                            oxygen saturations were monitored continuously. The                            CF-HQ190L ([id number]) Olympus colonoscope was                            introduced through the anus and advanced to the the                            cecum, identified by appendiceal orifice and                            ileocecal valve. The colonoscopy was technically                            difficult and complex due to significant looping                            and a tortuous colon. Successful completion of the                            procedure was aided by changing the patient's                            position, using manual pressure, straightening and                            shortening the scope to obtain bowel loop reduction                            and using scope torsion. Scope In: 8:33:57 AM Scope Out: 9:11:07 AM Scope Withdrawal Time: 0 hours 31 minutes 14 seconds  Total Procedure Duration: 0 hours 37 minutes 10 seconds  Findings:      The digital rectal exam findings include  hemorrhoids. Pertinent       negatives include no palpable rectal lesions.      A medium post mucosectomy scar was found in the cecum. There was a 1 mm       region laterally with residual polypoid tissue. This tissue was removed       with a cold snare. Resection and retrieval were complete.      A 20 mm polyp was found in the descending colon. The polyp was sessile.       Preparations were made for mucosal resection. Orise gel was injected to       raise the lesion. Piecemeal mucosal resection using a cold snare       technique was performed. Resection and retrieval were complete.      Six sessile polyps were found in the rectum (3), sigmoid colon (1),       descending colon (1) and ascending colon (1). The polyps were 2 to 4 mm       in size. These  polyps were removed with a cold snare. Resection and       retrieval were complete. The majority of these had appearance of       hyperplastic but small adenomas vs SSPs were considered and reason for       removing with her history.      Non-bleeding non-thrombosed internal hemorrhoids were found during       perianal exam, during digital exam and during endoscopy. The hemorrhoids       were Grade II (internal hemorrhoids that prolapse but reduce       spontaneously).      Normal mucosa was found in the entire colon otherwise.      Retroflexion with the Adult colonoscope proved to be difficult and to       not cause further trauma and since she has had 2 previous retroflexions,       I did not pursue this any further. Impression:               - Hemorrhoids found on digital rectal exam.                           - Post mucosectomy scar in the cecum. Small 1 mm                            polypoid tissue noted laterally - removed.                           - One 20 mm polyp in the descending colon, removed                            with mucosal resection. Resected and retrieved.                           - Six 2 to 4 mm polyps in the rectum, in the                            sigmoid colon, in the descending colon and in the  ascending colon, removed with a cold snare.                            Resected and retrieved.                           - Non-bleeding non-thrombosed internal hemorrhoids.                           - Normal mucosa in the entire examined colon. Recommendation:           - The patient will be observed post-procedure,                            until all discharge criteria are met.                           - Discharge patient to home.                           - Patient has a contact number available for                            emergencies. The signs and symptoms of potential                            delayed complications were discussed  with the                            patient. Return to normal activities tomorrow.                            Written discharge instructions were provided to the                            patient.                           - High fiber diet.                           - Use FiberCon 1 tablet PO daily.                           - Continue present medications.                           - No aspirin, ibuprofen, naproxen, or other                            non-steroidal anti-inflammatory drugs for 1 week                            after polyp removal.                           - Await pathology results.                           -  Repeat colonoscopy in 6-12 months for                            surveillance (based on things, I believe that she                            could be returned to her referring MD for further                            Colonoscopy, but will discuss with Dr. Carlean Purl).                           - The findings and recommendations were discussed                            with the patient. Procedure Code(s):        --- Professional ---                           [phone], Colonoscopy, flexible; with endoscopic                            mucosal resection                           45385, 33, Colonoscopy, flexible; with removal of                            tumor(s), polyp(s), or other lesion(s) by snare                            technique Diagnosis Code(s):        --- Professional ---                           Z86.010, Personal history of colonic polyps                           K64.1, Second degree hemorrhoids                           Z98.890, Other specified postprocedural states                           K63.5, Polyp of colon                           K62.1, Rectal polyp CPT copyright 2019 American Medical Association. All rights reserved. The codes documented in this report are preliminary and upon coder review may  be revised to meet current compliance  requirements. [name], MD 09/17/2019 9:36:38 AM Number of Addenda: 0

## 2019-09-17 NOTE — H&P (Signed)
GASTROENTEROLOGY PROCEDURE H&P NOTE   Primary Care Physician: Patient, No Pcp Per  HPI: Kathryn Church is a 53 y.o. female who presents for Colonoscopy with EMR  Past Medical History:  Diagnosis Date  . Colon polyps   . Depression   . Family history of adverse reaction to anesthesia    Pt mother has PONV  . Fibroid   . Heart murmur    as a child  . Pneumonia    as a child  . Wears glasses    Past Surgical History:  Procedure Laterality Date  . BREAST BIOPSY     benign  . COLONOSCOPY WITH PROPOFOL N/A 12/23/2018   Procedure: COLONOSCOPY WITH PROPOFOL;  Surgeon: Rush Landmark Telford Nab., MD;  Location: [place];  Service: Gastroenterology;  Laterality: N/A;  colonoscopy with EMR 90 minute case  . FOREIGN BODY REMOVAL  12/23/2018   Procedure: FOREIGN BODY REMOVAL;  Surgeon: Rush Landmark Telford Nab., MD;  Location: [place];  Service: Gastroenterology;;  . LAPAROSCOPIC TOTAL HYSTERECTOMY  02/16/09   Robotic assisted  . NOVASURE ABLATION    . TUBAL LIGATION     Current Facility-Administered Medications  Medication Dose Route Frequency Provider Last Rate Last Dose  . lactated ringers infusion   Intravenous Continuous Mansouraty, Telford Nab., MD 20 mL/hr at 09/17/19 [phone]     No Known Allergies Family History  Problem Relation Age of Onset  . Rheum arthritis Mother   . Hypertension Mother   . Alcoholism Father   . Stroke Maternal Grandmother        mini strokes  . Heart disease Maternal Grandfather        pacemaker  . Diabetes Maternal Grandfather   . Other Brother        vasculitis disease-unsure of all diag  . Heart disease Paternal Grandfather   . Leukemia Other   . Colon cancer Neg Hx   . Esophageal cancer Neg Hx   . Rectal cancer Neg Hx   . Stomach cancer Neg Hx   . Inflammatory bowel disease Neg Hx   . Liver disease Neg Hx   . Pancreatic cancer Neg Hx    Social History   Socioeconomic History  . Marital status: Single    Spouse name: Not on file  .  Number of children: Not on file  . Years of education: Not on file  . Highest education level: Not on file  Occupational History  . Not on file  Social Needs  . Financial resource strain: Not on file  . Food insecurity    Worry: Not on file    Inability: Not on file  . Transportation needs    Medical: Not on file    Non-medical: Not on file  Tobacco Use  . Smoking status: Current Every Day Smoker    Packs/day: 1.00    Types: Cigarettes  . Smokeless tobacco: Never Used  Substance and Sexual Activity  . Alcohol use: No  . Drug use: No  . Sexual activity: Not Currently    Partners: Male    Birth control/protection: Surgical    Comment: TLH  Lifestyle  . Physical activity    Days per week: Not on file    Minutes per session: Not on file  . Stress: Not on file  Relationships  . Social Herbalist on phone: Not on file    Gets together: Not on file    Attends religious service: Not on file    Active  member of club or organization: Not on file    Attends meetings of clubs or organizations: Not on file    Relationship status: Not on file  . Intimate partner violence    Fear of current or ex partner: Not on file    Emotionally abused: Not on file    Physically abused: Not on file    Forced sexual activity: Not on file  Other Topics Concern  . Not on file  Social History Narrative  . Not on file    Physical Exam: Vital signs in last 24 hours: Temp:  [98.9 F (37.2 C)] 98.9 F (37.2 C) (11/04 0732) Resp:  [12] 12 (11/04 0732) BP: (124)/(82) 124/82 (11/04 0732) SpO2:  [99 %] 99 % (11/04 0732) Weight:  [65.8 kg] 65.8 kg (11/04 0732)   GEN: NAD EYE: Sclerae anicteric ENT: MMM CV: Non-tachycardic GI: Soft, NT/ND NEURO:  Alert & Oriented x 3  Lab Results: No results for input(s): WBC, HGB, HCT, PLT in the last 72 hours. BMET No results for input(s): NA, K, CL, CO2, GLUCOSE, BUN, CREATININE, CALCIUM in the last 72 hours. LFT No results for input(s):  PROT, ALBUMIN, AST, ALT, ALKPHOS, BILITOT, BILIDIR, IBILI in the last 72 hours. PT/INR No results for input(s): LABPROT, INR in the last 72 hours.   Impression / Plan: This is a 53 y.o.female who presents for Colonoscopy with EMR.  The risks and benefits of endoscopic evaluation were discussed with the patient; these include but are not limited to the risk of perforation, infection, bleeding, missed lesions, lack of diagnosis, severe illness requiring hospitalization, as well as anesthesia and sedation related illnesses.  The patient is agreeable to proceed.    [name], MD [facility] Office # [phone]

## 2019-09-17 NOTE — Discharge Instructions (Signed)
° ° °  [facility] ENDOSCOPY [address] [place], [place]  [address] Phone:  [phone]   Patient: Kathryn Church  Date of Birth: 1965-11-25  Date of Visit: 08/12/2019   To Whom It May Concern:  Mckell Sciascia was seen and treated on 09/17/2019. Her Covid test and following required isolation were on 09/13/2019.  She may return to work 09/19/2019.           Sincerely,  Izreal Kock Isaac RN   Treatment Team:  Attending Provider: Irving Copas., MD          YOU HAD AN ENDOSCOPIC PROCEDURE TODAY: Refer to the procedure report and other information in the discharge instructions given to you for any specific questions about what was found during the examination. If this information does not answer your questions, please call [place] office at [phone] to clarify.   YOU SHOULD EXPECT: Some feelings of bloating in the abdomen. Passage of more gas than usual. Walking can help get rid of the air that was put into your GI tract during the procedure and reduce the bloating. If you had a lower endoscopy (such as a colonoscopy or flexible sigmoidoscopy) you may notice spotting of blood in your stool or on the toilet paper. Some abdominal soreness may be present for a day or two, also.  DIET: Your first meal following the procedure should be a light meal and then it is ok to progress to your normal diet. A half-sandwich or bowl of soup is an example of a good first meal. Heavy or fried foods are harder to digest and may make you feel nauseous or bloated. Drink plenty of fluids but you should avoid alcoholic beverages for 24 hours. If you had a esophageal dilation, please see attached instructions for diet.    ACTIVITY: Your care partner should take you home directly after the procedure. You should plan to take it easy, moving slowly for the rest of the day. You can resume normal activity the day after the procedure however YOU SHOULD NOT DRIVE, use power tools, machinery or  perform tasks that involve climbing or major physical exertion for 24 hours (because of the sedation medicines used during the test).   SYMPTOMS TO REPORT IMMEDIATELY: A gastroenterologist can be reached at any hour. Please call [phone]  for any of the following symptoms:  Following lower endoscopy (colonoscopy, flexible sigmoidoscopy) Excessive amounts of blood in the stool  Significant tenderness, worsening of abdominal pains  Swelling of the abdomen that is new, acute  Fever of 100 or higher    FOLLOW UP:  If any biopsies were taken you will be contacted by phone or by letter within the next 1-3 weeks. Call [phone]  if you have not heard about the biopsies in 3 weeks.  Please also call with any specific questions about appointments or follow up tests.

## 2019-09-17 NOTE — Anesthesia Postprocedure Evaluation (Signed)
Anesthesia Post Note  Patient: Kathryn Church  Procedure(s) Performed: COLONOSCOPY (N/A ) POLYPECTOMY ENDOSCOPIC MUCOSAL RESECTION SUBMUCOSAL LIFTING INJECTION     Patient location during evaluation: PACU Anesthesia Type: MAC Level of consciousness: awake and alert Pain management: pain level controlled Vital Signs Assessment: post-procedure vital signs reviewed and stable Respiratory status: spontaneous breathing, nonlabored ventilation, respiratory function stable and patient connected to nasal cannula oxygen Cardiovascular status: stable and blood pressure returned to baseline Postop Assessment: no apparent nausea or vomiting Anesthetic complications: no    Last Vitals:  Vitals:   09/17/19 0926 09/17/19 0935  BP: 125/72 105/68  Pulse: 64 66  Resp: 19 18  Temp:    SpO2: 100% 99%    Last Pain:  Vitals:   09/17/19 0916  TempSrc: Temporal  PainSc: 0-No pain                 Tiajuana Amass

## 2019-09-17 NOTE — Anesthesia Procedure Notes (Signed)
Procedure Name: MAC Date/Time: 09/17/2019 8:39 AM Performed by: Orlie Dakin, CRNA Pre-anesthesia Checklist: Patient identified, Emergency Drugs available, Suction available and Patient being monitored Patient Re-evaluated:Patient Re-evaluated prior to induction Oxygen Delivery Method: Simple face mask Preoxygenation: Pre-oxygenation with 100% oxygen Induction Type: IV induction

## 2019-09-18 ENCOUNTER — Encounter (HOSPITAL_COMMUNITY): Payer: Self-pay | Admitting: Gastroenterology

## 2019-09-18 LAB — SURGICAL PATHOLOGY

## 2019-09-19 ENCOUNTER — Encounter: Payer: Self-pay | Admitting: Gastroenterology

## 2019-10-17 DIAGNOSIS — G5622 Lesion of ulnar nerve, left upper limb: Secondary | ICD-10-CM | POA: Diagnosis not present

## 2019-10-17 DIAGNOSIS — G5602 Carpal tunnel syndrome, left upper limb: Secondary | ICD-10-CM | POA: Diagnosis not present

## 2019-12-03 DIAGNOSIS — R1084 Generalized abdominal pain: Secondary | ICD-10-CM | POA: Diagnosis not present

## 2019-12-03 DIAGNOSIS — I10 Essential (primary) hypertension: Secondary | ICD-10-CM | POA: Diagnosis not present

## 2019-12-03 DIAGNOSIS — Z6822 Body mass index (BMI) 22.0-22.9, adult: Secondary | ICD-10-CM | POA: Diagnosis not present

## 2019-12-03 DIAGNOSIS — Z0000 Encounter for general adult medical examination without abnormal findings: Secondary | ICD-10-CM | POA: Diagnosis not present

## 2020-02-18 DIAGNOSIS — S52592A Other fractures of lower end of left radius, initial encounter for closed fracture: Secondary | ICD-10-CM | POA: Diagnosis not present

## 2020-02-18 DIAGNOSIS — Y92019 Unspecified place in single-family (private) house as the place of occurrence of the external cause: Secondary | ICD-10-CM | POA: Diagnosis not present

## 2020-02-18 DIAGNOSIS — S59202A Unspecified physeal fracture of lower end of radius, left arm, initial encounter for closed fracture: Secondary | ICD-10-CM | POA: Diagnosis not present

## 2020-02-18 DIAGNOSIS — M25532 Pain in left wrist: Secondary | ICD-10-CM | POA: Diagnosis not present

## 2020-02-18 DIAGNOSIS — S52502A Unspecified fracture of the lower end of left radius, initial encounter for closed fracture: Secondary | ICD-10-CM | POA: Diagnosis not present

## 2020-02-18 DIAGNOSIS — W19XXXA Unspecified fall, initial encounter: Secondary | ICD-10-CM | POA: Diagnosis not present

## 2020-02-20 DIAGNOSIS — M25532 Pain in left wrist: Secondary | ICD-10-CM | POA: Diagnosis not present

## 2020-02-20 DIAGNOSIS — S52552A Other extraarticular fracture of lower end of left radius, initial encounter for closed fracture: Secondary | ICD-10-CM | POA: Diagnosis not present

## 2020-02-27 DIAGNOSIS — M25532 Pain in left wrist: Secondary | ICD-10-CM | POA: Diagnosis not present

## 2020-03-12 DIAGNOSIS — H3581 Retinal edema: Secondary | ICD-10-CM | POA: Diagnosis not present

## 2020-03-12 DIAGNOSIS — H35711 Central serous chorioretinopathy, right eye: Secondary | ICD-10-CM | POA: Diagnosis not present

## 2020-03-12 DIAGNOSIS — D3132 Benign neoplasm of left choroid: Secondary | ICD-10-CM | POA: Diagnosis not present

## 2020-03-12 DIAGNOSIS — H43823 Vitreomacular adhesion, bilateral: Secondary | ICD-10-CM | POA: Diagnosis not present

## 2020-03-15 DIAGNOSIS — M25532 Pain in left wrist: Secondary | ICD-10-CM | POA: Diagnosis not present

## 2020-04-08 DIAGNOSIS — M25532 Pain in left wrist: Secondary | ICD-10-CM | POA: Diagnosis not present

## 2020-04-27 DIAGNOSIS — Z1231 Encounter for screening mammogram for malignant neoplasm of breast: Secondary | ICD-10-CM | POA: Diagnosis not present

## 2020-05-18 ENCOUNTER — Other Ambulatory Visit: Payer: Self-pay

## 2020-05-18 ENCOUNTER — Telehealth: Payer: Self-pay | Admitting: Internal Medicine

## 2020-05-18 DIAGNOSIS — D126 Benign neoplasm of colon, unspecified: Secondary | ICD-10-CM

## 2020-05-18 NOTE — Telephone Encounter (Signed)
Patient calling to schedule recall at the hospital

## 2020-05-18 NOTE — Telephone Encounter (Signed)
OK to schedule  Might need APC

## 2020-05-18 NOTE — Telephone Encounter (Signed)
Dr. Carlean Purl, please see hospital procedure report from 09/2019.  Okay to schedule repeat case now?  Recall says 6-12 months?  Do you need any additional equipment?

## 2020-05-19 NOTE — Telephone Encounter (Signed)
Patient has been scheduled for a colonoscopy on 06/01/20 at [facility].  Instructions mailed to the patient.  She will call for any questions or concerns.

## 2020-05-28 ENCOUNTER — Other Ambulatory Visit (HOSPITAL_COMMUNITY)
Admission: RE | Admit: 2020-05-28 | Discharge: 2020-05-28 | Disposition: A | Discharge status: Home / Self Care | Payer: BC Managed Care – PPO | Source: Ambulatory Visit | Attending: Internal Medicine | Admitting: Internal Medicine

## 2020-05-28 DIAGNOSIS — Z20822 Contact with and (suspected) exposure to covid-19: Secondary | ICD-10-CM | POA: Insufficient documentation

## 2020-05-28 DIAGNOSIS — Z01812 Encounter for preprocedural laboratory examination: Secondary | ICD-10-CM | POA: Insufficient documentation

## 2020-05-28 LAB — SARS CORONAVIRUS 2 (TAT 6-24 HRS): SARS Coronavirus 2: NEGATIVE

## 2020-05-31 NOTE — Progress Notes (Signed)
Pre op call done for patient's endo procedure tomorrow 7/20. Patient states she has been quarantined since covid test, will arrive to hospital at [place], and has confirmed a ride home. All questions addressed.

## 2020-06-01 ENCOUNTER — Ambulatory Visit (HOSPITAL_COMMUNITY): Payer: BC Managed Care – PPO | Admitting: Anesthesiology

## 2020-06-01 ENCOUNTER — Other Ambulatory Visit: Payer: Self-pay

## 2020-06-01 ENCOUNTER — Ambulatory Visit (HOSPITAL_COMMUNITY)
Admission: RE | Admit: 2020-06-01 | Discharge: 2020-06-01 | Disposition: A | Discharge status: Home / Self Care | Payer: BC Managed Care – PPO | Attending: Internal Medicine | Admitting: Internal Medicine

## 2020-06-01 ENCOUNTER — Encounter (HOSPITAL_COMMUNITY)
Admission: RE | Disposition: A | Discharge status: Home / Self Care | Payer: Self-pay | Source: Home / Self Care | Attending: Internal Medicine

## 2020-06-01 DIAGNOSIS — Z86010 Personal history of colonic polyps: Secondary | ICD-10-CM | POA: Diagnosis not present

## 2020-06-01 DIAGNOSIS — D126 Benign neoplasm of colon, unspecified: Secondary | ICD-10-CM

## 2020-06-01 DIAGNOSIS — Z1211 Encounter for screening for malignant neoplasm of colon: Secondary | ICD-10-CM | POA: Insufficient documentation

## 2020-06-01 DIAGNOSIS — F17210 Nicotine dependence, cigarettes, uncomplicated: Secondary | ICD-10-CM | POA: Diagnosis not present

## 2020-06-01 DIAGNOSIS — K644 Residual hemorrhoidal skin tags: Secondary | ICD-10-CM | POA: Insufficient documentation

## 2020-06-01 HISTORY — PX: COLONOSCOPY WITH PROPOFOL: SHX5780

## 2020-06-01 SURGERY — COLONOSCOPY WITH PROPOFOL
Anesthesia: Monitor Anesthesia Care

## 2020-06-01 MED ORDER — PROPOFOL 10 MG/ML IV BOLUS
INTRAVENOUS | Status: DC | PRN
  Administered 2020-06-01 (×4): 20 mg via INTRAVENOUS

## 2020-06-01 MED ORDER — SODIUM CHLORIDE 0.9 % IV SOLN: INTRAVENOUS | Status: DC

## 2020-06-01 MED ORDER — LACTATED RINGERS IV SOLN
INTRAVENOUS | Status: DC
  Administered 2020-06-01: 1000 mL via INTRAVENOUS

## 2020-06-01 MED ORDER — PROPOFOL 500 MG/50ML IV EMUL
INTRAVENOUS | Status: AC
  Filled 2020-06-01: qty 50

## 2020-06-01 MED ORDER — PROPOFOL 500 MG/50ML IV EMUL
INTRAVENOUS | Status: DC | PRN
  Administered 2020-06-01: 100 ug/kg/min via INTRAVENOUS

## 2020-06-01 MED ORDER — PROPOFOL 10 MG/ML IV BOLUS
INTRAVENOUS | Status: AC
  Filled 2020-06-01: qty 20

## 2020-06-01 SURGICAL SUPPLY — 22 items

## 2020-06-01 NOTE — Transfer of Care (Signed)
Immediate Anesthesia Transfer of Care Note  Patient: Kathryn Church  Procedure(s) Performed: COLONOSCOPY WITH PROPOFOL (N/A )  Patient Location: Endoscopy Unit  Anesthesia Type:MAC  Level of Consciousness: awake  Airway & Oxygen Therapy: Patient Spontanous Breathing and Patient connected to face mask oxygen  Post-op Assessment: Report given to RN and Post -op Vital signs reviewed and stable  Post vital signs: Reviewed and stable  Last Vitals:  Vitals Value Taken Time  BP    Temp    Pulse    Resp    SpO2      Last Pain:  Vitals:   06/01/20 0923  TempSrc: Oral  PainSc: 0-No pain         Complications: No complications documented.

## 2020-06-01 NOTE — Discharge Instructions (Signed)
No polyps today!  I recommend repeat exam in 3 years.  I appreciate the opportunity to care for you. Gatha Mayer, MD, FACG  YOU HAD AN ENDOSCOPIC PROCEDURE TODAY: Refer to the procedure report and other information in the discharge instructions given to you for any specific questions about what was found during the examination. If this information does not answer your questions, please call Dr. Celesta Aver office at [phone] to clarify.   YOU SHOULD EXPECT: Some feelings of bloating in the abdomen. Passage of more gas than usual. Walking can help get rid of the air that was put into your GI tract during the procedure and reduce the bloating. If you had a lower endoscopy (such as a colonoscopy or flexible sigmoidoscopy) you may notice spotting of blood in your stool or on the toilet paper. Some abdominal soreness may be present for a day or two, also.  DIET: Your first meal following the procedure should be a light meal and then it is ok to progress to your normal diet. A half-sandwich or bowl of soup is an example of a good first meal. Heavy or fried foods are harder to digest and may make you feel nauseous or bloated. Drink plenty of fluids but you should avoid alcoholic beverages for 24 hours.   ACTIVITY: Your care partner should take you home directly after the procedure. You should plan to take it easy, moving slowly for the rest of the day. You can resume normal activity the day after the procedure however YOU SHOULD NOT DRIVE, use power tools, machinery or perform tasks that involve climbing or major physical exertion for 24 hours (because of the sedation medicines used during the test).   SYMPTOMS TO REPORT IMMEDIATELY: A gastroenterologist can be reached at any hour. Please call [phone]  for any of the following symptoms:  Following lower endoscopy (colonoscopy, flexible sigmoidoscopy) Excessive amounts of blood in the stool  Significant tenderness, worsening of abdominal pains    Swelling of the abdomen that is new, acute  Fever of 100 or higher

## 2020-06-01 NOTE — Anesthesia Preprocedure Evaluation (Addendum)
Anesthesia Evaluation  Patient identified by MRN, date of birth, ID band Patient awake    Reviewed: Allergy & Precautions, NPO status , Patient's Chart, lab work & pertinent test results  History of Anesthesia Complications (+) Family history of anesthesia reaction  Airway Mallampati: I  TM Distance: >3 FB Neck ROM: Full    Dental  (+) Teeth Intact, Dental Advisory Given   Pulmonary Current Smoker,    breath sounds clear to auscultation       Cardiovascular  Rhythm:Regular Rate:Normal     Neuro/Psych PSYCHIATRIC DISORDERS Depression    GI/Hepatic   Endo/Other    Renal/GU      Musculoskeletal   Abdominal   Peds  Hematology   Anesthesia Other Findings   Reproductive/Obstetrics                            Anesthesia Physical  Anesthesia Plan  ASA: II  Anesthesia Plan: MAC   Post-op Pain Management:    Induction: Intravenous  PONV Risk Score and Plan: 1 and Propofol infusion and Treatment may vary due to age or medical condition  Airway Management Planned: Natural Airway and Simple Face Mask  Additional Equipment:   Intra-op Plan:   Post-operative Plan:   Informed Consent: I have reviewed the patients History and Physical, chart, labs and discussed the procedure including the risks, benefits and alternatives for the proposed anesthesia with the patient or authorized representative who has indicated his/her understanding and acceptance.       Plan Discussed with: CRNA and Anesthesiologist  Anesthesia Plan Comments:         Anesthesia Quick Evaluation

## 2020-06-01 NOTE — H&P (Signed)
[facility] History and Physical   Primary Care Physician:  Patient, No Pcp Per   Reason for Procedure:   hx colon polyps  Plan:    Colonoscopy The risks and benefits as well as alternatives of endoscopic procedure(s) have been discussed and reviewed. All questions answered. The patient agrees to proceed.      HPI: Kathryn Church is a 54 y.o. female w/ hx large colon polyps and multiple polyps removed in past by EMR here for a surveillance exam.   Past Medical History:  Diagnosis Date  . Colon polyps   . Depression   . Family history of adverse reaction to anesthesia    Pt mother has PONV  . Fibroid   . Heart murmur    as a child  . Pneumonia    as a child  . Wears glasses     Past Surgical History:  Procedure Laterality Date  . BREAST BIOPSY     benign  . COLONOSCOPY N/A 09/17/2019   Procedure: COLONOSCOPY;  Surgeon: Rush Landmark Telford Nab., MD;  Location: [place];  Service: Gastroenterology;  Laterality: N/A;  . COLONOSCOPY WITH PROPOFOL N/A 12/23/2018   Procedure: COLONOSCOPY WITH PROPOFOL;  Surgeon: Rush Landmark Telford Nab., MD;  Location: [place];  Service: Gastroenterology;  Laterality: N/A;  colonoscopy with EMR 90 minute case  . ENDOSCOPIC MUCOSAL RESECTION  09/17/2019   Procedure: ENDOSCOPIC MUCOSAL RESECTION;  Surgeon: Rush Landmark Telford Nab., MD;  Location: [place];  Service: Gastroenterology;;  . FOREIGN BODY REMOVAL  12/23/2018   Procedure: FOREIGN BODY REMOVAL;  Surgeon: Irving Copas., MD;  Location: [place];  Service: Gastroenterology;;  . LAPAROSCOPIC TOTAL HYSTERECTOMY  02/16/09   Robotic assisted  . NOVASURE ABLATION    . POLYPECTOMY  09/17/2019   Procedure: POLYPECTOMY;  Surgeon: Mansouraty, Telford Nab., MD;  Location: [place];  Service: Gastroenterology;;  . Lia Foyer LIFTING INJECTION  09/17/2019   Procedure: SUBMUCOSAL LIFTING INJECTION;  Surgeon: Irving Copas., MD;  Location: [place];   Service: Gastroenterology;;  . TUBAL LIGATION      Prior to Admission medications   Medication Sig Start Date End Date Taking? Authorizing Provider  acetaminophen (TYLENOL) 500 MG tablet Take 1,000 mg by mouth every 8 (eight) hours as needed for moderate pain or headache.   Yes [provider]    Current Facility-Administered Medications  Medication Dose Route Frequency Provider Last Rate Last Admin  . 0.9 %  sodium chloride infusion   Intravenous Continuous Gatha Mayer, MD      . lactated ringers infusion   Intravenous Continuous Gatha Mayer, MD 10 mL/hr at 06/01/20 0934 1,000 mL at 06/01/20 0934    Allergies as of 05/18/2020  . (No Known Allergies)    Family History  Problem Relation Age of Onset  . Rheum arthritis Mother   . Hypertension Mother   . Alcoholism Father   . Stroke Maternal Grandmother        mini strokes  . Heart disease Maternal Grandfather        pacemaker  . Diabetes Maternal Grandfather   . Other Brother        vasculitis disease-unsure of all diag  . Heart disease Paternal Grandfather   . Leukemia Other   . Colon cancer Neg Hx   . Esophageal cancer Neg Hx   . Rectal cancer Neg Hx   . Stomach cancer Neg Hx   . Inflammatory bowel disease Neg Hx   . Liver disease Neg Hx   .  Pancreatic cancer Neg Hx     Social History   Tobacco Use  . Smoking status: Current Every Day Smoker    Packs/day: 1.00    Types: Cigarettes  . Smokeless tobacco: Never Used  Vaping Use  . Vaping Use: Never used  Substance Use Topics  . Alcohol use: No  . Drug use: No     Review of Systems:  All other review of systems negative except as mentioned in the HPI.  Physical Exam: Vital signs in last 24 hours: Temp:  [98 F (36.7 C)] 98 F (36.7 C) (07/20 0923) Pulse Rate:  [78] 78 (07/20 0923) Resp:  [15] 15 (07/20 0923) BP: (132)/(82) 132/82 (07/20 0923) SpO2:  [99 %] 99 % (07/20 0923) Weight:  [65.8 kg] 65.8 kg (07/20 0923)   General:   Alert,   Well-developed, well-nourished, pleasant and cooperative in NAD Lungs:  Clear throughout to auscultation.   Heart:  Regular rate and rhythm; no murmurs, clicks, rubs,  or gallops. Abdomen:  Soft, nontender and nondistended. Normal bowel sounds.   Neuro/Psych:  Alert and cooperative. Normal mood and affect. A and O x 3   @Nazar Kuan  Simonne Maffucci, MD, [facility] [phone] (pager) 06/01/2020 9:40 AM@

## 2020-06-01 NOTE — Op Note (Signed)
[facility] Patient Name: Kathryn Church Procedure Date: 06/01/2020 MRN: [id number] Attending MD: Gatha Mayer , MD Date of Birth: Mar 31, 1966 CSN: [id number] Age: 54 Admit Type: Outpatient Procedure:                Colonoscopy Indications:              Surveillance: Piecemeal removal of large sessile                            adenoma last colonoscopy (< 3 yrs) Providers:                Gatha Mayer, MD, Cleda Daub, RN, Laverda Sorenson, Technician, Tyrone Apple, Technician,                            Danley Danker, CRNA Referring MD:              Medicines:                Propofol per Anesthesia, Monitored Anesthesia Care Complications:            No immediate complications. Estimated Blood Loss:     Estimated blood loss: none. Procedure:                Pre-Anesthesia Assessment:                           - Prior to the procedure, a History and Physical                            was performed, and patient medications and                            allergies were reviewed. The patient's tolerance of                            previous anesthesia was also reviewed. The risks                            and benefits of the procedure and the sedation                            options and risks were discussed with the patient.                            All questions were answered, and informed consent                            was obtained. Prior Anticoagulants: The patient has                            taken no previous anticoagulant or antiplatelet  agents. ASA Grade Assessment: II - A patient with                            mild systemic disease. After reviewing the risks                            and benefits, the patient was deemed in                            satisfactory condition to undergo the procedure.                           After obtaining informed consent, the colonoscope                             was passed under direct vision. Throughout the                            procedure, the patient's blood pressure, pulse, and                            oxygen saturations were monitored continuously. The                            CF-HQ190L ([id number]) Olympus colonoscope was                            introduced through the anus and advanced to the the                            cecum, identified by appendiceal orifice and                            ileocecal valve. The colonoscopy was performed                            without difficulty with the aid of an abdominal                            binder. The patient tolerated the procedure well.                            The quality of the bowel preparation was excellent.                            The ileocecal valve, appendiceal orifice, and                            rectum were photographed. Scope In: 9:57:57 AM Scope Out: 10:10:28 AM Scope Withdrawal Time: 0 hours 9 minutes 3 seconds  Total Procedure Duration: 0 hours 12 minutes 31 seconds  Findings:      Skin tags were found on perianal exam.      The exam was otherwise without abnormality on direct and  retroflexion       views. Impression:               - Perianal skin tags found on perianal exam.                           - The examination was otherwise normal on direct                            and retroflexion views. Faint cecal scar from prior                            polypectomy. Abdominal binder used to facilitate                            procedure given hx looping and redundnacy in past.                           - No specimens collected.                           - Personal history of colonic polyps. Large                            sessile/flat villous adenoma of cecum, 20 mm                            descending adenoma and others also 2019-2020 Moderate Sedation:      Not Applicable - Patient had care per Anesthesia. Recommendation:           - Patient has a  contact number available for                            emergencies. The signs and symptoms of potential                            delayed complications were discussed with the                            patient. Return to normal activities tomorrow.                            Written discharge instructions were provided to the                            patient.                           - Resume previous diet.                           - Continue present medications.                           - Repeat colonoscopy in 3 years for surveillance.  Use abdominal binder. Procedure Code(s):        --- Professional ---                           J1552, Colorectal cancer screening; colonoscopy on                            individual at high risk Diagnosis Code(s):        --- Professional ---                           Z86.010, Personal history of colonic polyps                           K64.4, Residual hemorrhoidal skin tags CPT copyright 2019 American Medical Association. All rights reserved. The codes documented in this report are preliminary and upon coder review may  be revised to meet current compliance requirements. Gatha Mayer, MD 06/01/2020 10:30:50 AM This report has been signed electronically. Number of Addenda: 0

## 2020-06-01 NOTE — Anesthesia Postprocedure Evaluation (Signed)
Anesthesia Post Note  Patient: Kathryn Church  Procedure(s) Performed: COLONOSCOPY WITH PROPOFOL (N/A )     Patient location during evaluation: PACU Anesthesia Type: MAC Level of consciousness: awake and alert Pain management: pain level controlled Vital Signs Assessment: post-procedure vital signs reviewed and stable Respiratory status: spontaneous breathing, nonlabored ventilation, respiratory function stable and patient connected to nasal cannula oxygen Cardiovascular status: stable and blood pressure returned to baseline Postop Assessment: no apparent nausea or vomiting Anesthetic complications: no   No complications documented.  Last Vitals:  Vitals:   06/01/20 1020 06/01/20 1042  BP:  125/76  Pulse:  66  Resp:  19  Temp:    SpO2: 100% 100%    Last Pain:  Vitals:   06/01/20 1042  TempSrc:   PainSc: 0-No pain                 Sereen Schaff

## 2020-06-01 NOTE — Anesthesia Procedure Notes (Signed)
Date/Time: 06/01/2020 9:50 AM Performed by: Sharlette Dense, CRNA Oxygen Delivery Method: Simple face mask

## 2020-06-02 ENCOUNTER — Encounter (HOSPITAL_COMMUNITY): Payer: Self-pay | Admitting: Internal Medicine

## 2020-06-10 ENCOUNTER — Encounter: Payer: Self-pay | Admitting: Obstetrics and Gynecology

## 2020-06-28 NOTE — Progress Notes (Addendum)
54 y.o. [id number] Single White or Caucasian Not Hispanic or Latino female here for annual exam.  H/O hysterectomy.     No bowel or bladder issues.   Patient's last menstrual period was 02/16/2009.          Sexually active: No.  The current method of family planning is status post hysterectomy.    Exercising: No.  The patient does not participate in regular exercise at present. Smoker:  Quit 3 days ago   Health Maintenance: Pap:  2010 WNL  History of abnormal Pap:  no MMG:  06/10/20 Density B Bi-rads 1 neg  BMD:   Never  Colonoscopy: 06/01/20 normal repeat 3 years  TDaP:  2014 Gardasil: NA   reports that she has quit smoking. Her smoking use included cigarettes. She has never used smokeless tobacco. She reports that she does not drink alcohol and does not use drugs. She makes beer bottles, rotating shift work. 3 sons. One son is a Engineer, structural, one is in rehab, other was a Engineer, structural and quit. Kids are grown, 6 grandchildren, all local. She has 3 dogs and a cat.   Past Medical History:  Diagnosis Date  . Colon polyps   . Depression   . Family history of adverse reaction to anesthesia    Pt mother has PONV  . Fibroid   . Heart murmur    as a child  . Pneumonia    as a child  . Wears glasses     Past Surgical History:  Procedure Laterality Date  . BREAST BIOPSY     benign  . COLONOSCOPY N/A 09/17/2019   Procedure: COLONOSCOPY;  Surgeon: Rush Landmark Telford Nab., MD;  Location: [place];  Service: Gastroenterology;  Laterality: N/A;  . COLONOSCOPY WITH PROPOFOL N/A 12/23/2018   Procedure: COLONOSCOPY WITH PROPOFOL;  Surgeon: Rush Landmark Telford Nab., MD;  Location: [place];  Service: Gastroenterology;  Laterality: N/A;  colonoscopy with EMR 90 minute case  . COLONOSCOPY WITH PROPOFOL N/A 06/01/2020   Procedure: COLONOSCOPY WITH PROPOFOL;  Surgeon: Gatha Mayer, MD;  Location: [facility];  Service: Endoscopy;  Laterality: N/A;  . ENDOSCOPIC MUCOSAL RESECTION   09/17/2019   Procedure: ENDOSCOPIC MUCOSAL RESECTION;  Surgeon: Rush Landmark Telford Nab., MD;  Location: [place];  Service: Gastroenterology;;  . FOREIGN BODY REMOVAL  12/23/2018   Procedure: FOREIGN BODY REMOVAL;  Surgeon: Irving Copas., MD;  Location: [place];  Service: Gastroenterology;;  . LAPAROSCOPIC TOTAL HYSTERECTOMY  02/16/09   Robotic assisted  . NOVASURE ABLATION    . POLYPECTOMY  09/17/2019   Procedure: POLYPECTOMY;  Surgeon: Mansouraty, Telford Nab., MD;  Location: [place];  Service: Gastroenterology;;  . Lia Foyer LIFTING INJECTION  09/17/2019   Procedure: SUBMUCOSAL LIFTING INJECTION;  Surgeon: Irving Copas., MD;  Location: [place];  Service: Gastroenterology;;  . TUBAL LIGATION      Current Outpatient Medications  Medication Sig Dispense Refill  . acetaminophen (TYLENOL) 500 MG tablet Take 1,000 mg by mouth every 8 (eight) hours as needed for moderate pain or headache.     No current facility-administered medications for this visit.    Family History  Problem Relation Age of Onset  . Rheum arthritis Mother   . Hypertension Mother   . Alcoholism Father   . Stroke Maternal Grandmother        mini strokes  . Heart disease Maternal Grandfather        pacemaker  . Diabetes Maternal Grandfather   . Other Brother  vasculitis disease-unsure of all diag  . Heart disease Paternal Grandfather   . Leukemia Other   . Colon cancer Neg Hx   . Esophageal cancer Neg Hx   . Rectal cancer Neg Hx   . Stomach cancer Neg Hx   . Inflammatory bowel disease Neg Hx   . Liver disease Neg Hx   . Pancreatic cancer Neg Hx     Review of Systems  All other systems reviewed and are negative.   Exam:   BP 126/68   Pulse 82   Ht 5\' 6"  (1.676 m)   Wt 146 lb (66.2 kg)   LMP 02/16/2009   SpO2 100%   BMI 23.57 kg/m   Weight change: @WEIGHTCHANGE @ Height:   Height: 5\' 6"  (167.6 cm)  Ht Readings from Last 3 Encounters:  06/30/20 5\' 6"  (1.676 m)   06/01/20 5\' 7"  (1.702 m)  09/17/19 5\' 6"  (1.676 m)    General appearance: alert, cooperative and appears stated age Head: Normocephalic, without obvious abnormality, atraumatic Neck: no adenopathy, supple, symmetrical, trachea midline and thyroid normal to inspection and palpation Lungs: clear to auscultation bilaterally Cardiovascular: regular rate and rhythm Breasts: normal appearance, no masses or tenderness Abdomen: soft, non-tender; non distended,  no masses,  no organomegaly Extremities: extremities normal, atraumatic, no cyanosis or edema Skin: Skin color, texture, turgor normal. No rashes or lesions Lymph nodes: Cervical, supraclavicular, and axillary nodes normal. No abnormal inguinal nodes palpated Neurologic: Grossly normal   Pelvic: External genitalia:  no lesions              Urethra:  normal appearing urethra with no masses, tenderness or lesions              Bartholins and Skenes: normal                 Vagina: normal appearing vagina with normal color and discharge, no lesions              Cervix: absent               Bimanual Exam:  Uterus:  uterus absent              Adnexa: no mass, fullness, tenderness               Rectovaginal: Confirms               Anus:  normal sphincter tone, no lesions  Karmen Bongo chaperoned for the exam.  A:  Well Woman with normal exam  H/O hysterectomy  H/O elevated calcium  Just quit smoking!  P:   No pap needed  Mammogram and colonoscopy are UTD  Screening labs PTH  Discussed breast self exam  Discussed calcium and vit D intake

## 2020-06-30 ENCOUNTER — Encounter: Payer: Self-pay | Admitting: Obstetrics and Gynecology

## 2020-06-30 ENCOUNTER — Other Ambulatory Visit: Payer: Self-pay

## 2020-06-30 ENCOUNTER — Ambulatory Visit: Payer: BC Managed Care – PPO | Admitting: Obstetrics and Gynecology

## 2020-06-30 VITALS — BP 126/68 | HR 82 | Ht 66.0 in | Wt 146.0 lb

## 2020-06-30 DIAGNOSIS — Z01419 Encounter for gynecological examination (general) (routine) without abnormal findings: Secondary | ICD-10-CM

## 2020-06-30 DIAGNOSIS — Z8639 Personal history of other endocrine, nutritional and metabolic disease: Secondary | ICD-10-CM

## 2020-06-30 DIAGNOSIS — Z0000 Encounter for general adult medical examination without abnormal findings: Secondary | ICD-10-CM

## 2020-06-30 NOTE — Patient Instructions (Signed)
EXERCISE AND DIET:  We recommended that you start or continue a regular exercise program for good health. Regular exercise means any activity that makes your heart beat faster and makes you sweat.  We recommend exercising at least 30 minutes per day at least 3 days a week, preferably 4 or 5.  We also recommend a diet low in fat and sugar.  Inactivity, poor dietary choices and obesity can cause diabetes, heart attack, stroke, and kidney damage, among others.    ALCOHOL AND SMOKING:  Women should limit their alcohol intake to no more than 7 drinks/beers/glasses of wine (combined, not each!) per week. Moderation of alcohol intake to this level decreases your risk of breast cancer and liver damage. And of course, no recreational drugs are part of a healthy lifestyle.  And absolutely no smoking or even second hand smoke. Most people know smoking can cause heart and lung diseases, but did you know it also contributes to weakening of your bones? Aging of your skin?  Yellowing of your teeth and nails?  CALCIUM AND VITAMIN D:  Adequate intake of calcium and Vitamin D are recommended.  The recommendations for exact amounts of these supplements seem to change often, but generally speaking 1,000 mg of calcium (between diet and supplement) and 800 units of Vitamin D per day seems prudent. Certain women may benefit from higher intake of Vitamin D.  If you are among these women, your doctor will have told you during your visit.    PAP SMEARS:  Pap smears, to check for cervical cancer or precancers,  have traditionally been done yearly, although recent scientific advances have shown that most women can have pap smears less often.  However, every woman still should have a physical exam from her gynecologist every year. It will include a breast check, inspection of the vulva and vagina to check for abnormal growths or skin changes, a visual exam of the cervix, and then an exam to evaluate the size and shape of the uterus and  ovaries.  And after 56 years of age, a rectal exam is indicated to check for rectal cancers. We will also provide age appropriate advice regarding health maintenance, like when you should have certain vaccines, screening for sexually transmitted diseases, bone density testing, colonoscopy, mammograms, etc.   MAMMOGRAMS:  All women over 40 years old should have a yearly mammogram. Many facilities now offer a "3D" mammogram, which may cost around $50 extra out of pocket. If possible,  we recommend you accept the option to have the 3D mammogram performed.  It both reduces the number of women who will be called back for extra views which then turn out to be normal, and it is better than the routine mammogram at detecting truly abnormal areas.    COLON CANCER SCREENING: Now recommend starting at age 45. At this time colonoscopy is not covered for routine screening until 50. There are take home tests that can be done between 45-49.   COLONOSCOPY:  Colonoscopy to screen for colon cancer is recommended for all women at age 50.  We know, you hate the idea of the prep.  We agree, BUT, having colon cancer and not knowing it is worse!!  Colon cancer so often starts as a polyp that can be seen and removed at colonscopy, which can quite literally save your life!  And if your first colonoscopy is normal and you have no family history of colon cancer, most women don't have to have it again for   10 years.  Once every ten years, you can do something that may end up saving your life, right?  We will be happy to help you get it scheduled when you are ready.  Be sure to check your insurance coverage so you understand how much it will cost.  It may be covered as a preventative service at no cost, but you should check your particular policy.      Breast Self-Awareness Breast self-awareness means being familiar with how your breasts look and feel. It involves checking your breasts regularly and reporting any changes to your  health care provider. Practicing breast self-awareness is important. A change in your breasts can be a sign of a serious medical problem. Being familiar with how your breasts look and feel allows you to find any problems early, when treatment is more likely to be successful. All women should practice breast self-awareness, including women who have had breast implants. How to do a breast self-exam One way to learn what is normal for your breasts and whether your breasts are changing is to do a breast self-exam. To do a breast self-exam: Look for Changes  1. Remove all the clothing above your waist. 2. Stand in front of a mirror in a room with good lighting. 3. Put your hands on your hips. 4. Push your hands firmly downward. 5. Compare your breasts in the mirror. Look for differences between them (asymmetry), such as: ? Differences in shape. ? Differences in size. ? Puckers, dips, and bumps in one breast and not the other. 6. Look at each breast for changes in your skin, such as: ? Redness. ? Scaly areas. 7. Look for changes in your nipples, such as: ? Discharge. ? Bleeding. ? Dimpling. ? Redness. ? A change in position. Feel for Changes Carefully feel your breasts for lumps and changes. It is best to do this while lying on your back on the floor and again while sitting or standing in the shower or tub with soapy water on your skin. Feel each breast in the following way:  Place the arm on the side of the breast you are examining above your head.  Feel your breast with the other hand.  Start in the nipple area and make  inch (2 cm) overlapping circles to feel your breast. Use the pads of your three middle fingers to do this. Apply light pressure, then medium pressure, then firm pressure. The light pressure will allow you to feel the tissue closest to the skin. The medium pressure will allow you to feel the tissue that is a little deeper. The firm pressure will allow you to feel the tissue  close to the ribs.  Continue the overlapping circles, moving downward over the breast until you feel your ribs below your breast.  Move one finger-width toward the center of the body. Continue to use the  inch (2 cm) overlapping circles to feel your breast as you move slowly up toward your collarbone.  Continue the up and down exam using all three pressures until you reach your armpit.  Write Down What You Find  Write down what is normal for each breast and any changes that you find. Keep a written record with breast changes or normal findings for each breast. By writing this information down, you do not need to depend only on memory for size, tenderness, or location. Write down where you are in your menstrual cycle, if you are still menstruating. If you are having trouble noticing differences   in your breasts, do not get discouraged. With time you will become more familiar with the variations in your breasts and more comfortable with the exam. How often should I examine my breasts? Examine your breasts every month. If you are breastfeeding, the best time to examine your breasts is after a feeding or after using a breast pump. If you menstruate, the best time to examine your breasts is 5-7 days after your period is over. During your period, your breasts are lumpier, and it may be more difficult to notice changes. When should I see my health care provider? See your health care provider if you notice:  A change in shape or size of your breasts or nipples.  A change in the skin of your breast or nipples, such as a reddened or scaly area.  Unusual discharge from your nipples.  A lump or thick area that was not there before.  Pain in your breasts.  Anything that concerns you.  

## 2020-07-01 LAB — COMPREHENSIVE METABOLIC PANEL
ALT: 17 IU/L (ref 0–32)
AST: 20 IU/L (ref 0–40)
Albumin/Globulin Ratio: 1.8 (ref 1.2–2.2)
Albumin: 4.3 g/dL (ref 3.8–4.9)
Alkaline Phosphatase: 104 IU/L (ref 48–121)
BUN/Creatinine Ratio: 22 (ref 9–23)
BUN: 15 mg/dL (ref 6–24)
Bilirubin Total: 0.4 mg/dL (ref 0.0–1.2)
CO2: 23 mmol/L (ref 20–29)
Calcium: 10.3 mg/dL — ABNORMAL HIGH (ref 8.7–10.2)
Chloride: 108 mmol/L — ABNORMAL HIGH (ref 96–106)
Creatinine, Ser: 0.69 mg/dL (ref 0.57–1.00)
GFR calc Af Amer: 113 mL/min/{1.73_m2} (ref >59)
GFR calc non Af Amer: 98 mL/min/{1.73_m2} (ref >59)
Globulin, Total: 2.4 g/dL (ref 1.5–4.5)
Glucose: 85 mg/dL (ref 65–99)
Potassium: 4.2 mmol/L (ref 3.5–5.2)
Sodium: 141 mmol/L (ref 134–144)
Total Protein: 6.7 g/dL (ref 6.0–8.5)

## 2020-07-01 LAB — PTH, INTACT AND CALCIUM: PTH: 50 pg/mL (ref 15–65)

## 2020-07-01 LAB — LIPID PANEL
Chol/HDL Ratio: 5.9 ratio — ABNORMAL HIGH (ref 0.0–4.4)
Cholesterol, Total: 200 mg/dL — ABNORMAL HIGH (ref 100–199)
HDL: 34 mg/dL — ABNORMAL LOW (ref >39)
LDL Chol Calc (NIH): 138 mg/dL — ABNORMAL HIGH (ref 0–99)
Triglycerides: 154 mg/dL — ABNORMAL HIGH (ref 0–149)
VLDL Cholesterol Cal: 28 mg/dL (ref 5–40)

## 2020-07-01 LAB — CBC
Hematocrit: 38.1 % (ref 34.0–46.6)
Hemoglobin: 13.1 g/dL (ref 11.1–15.9)
MCH: 31.7 pg (ref 26.6–33.0)
MCHC: 34.4 g/dL (ref 31.5–35.7)
MCV: 92 fL (ref 79–97)
Platelets: 266 10*3/uL (ref 150–450)
RBC: 4.13 x10E6/uL (ref 3.77–5.28)
RDW: 11.8 % (ref 11.7–15.4)
WBC: 5.5 10*3/uL (ref 3.4–10.8)

## 2020-08-05 DIAGNOSIS — G4700 Insomnia, unspecified: Secondary | ICD-10-CM | POA: Diagnosis not present

## 2020-08-05 DIAGNOSIS — I10 Essential (primary) hypertension: Secondary | ICD-10-CM | POA: Diagnosis not present

## 2020-08-05 DIAGNOSIS — Z6821 Body mass index (BMI) 21.0-21.9, adult: Secondary | ICD-10-CM | POA: Diagnosis not present

## 2020-08-05 DIAGNOSIS — E7849 Other hyperlipidemia: Secondary | ICD-10-CM | POA: Diagnosis not present

## 2020-10-05 DIAGNOSIS — H35711 Central serous chorioretinopathy, right eye: Secondary | ICD-10-CM | POA: Diagnosis not present

## 2020-10-05 DIAGNOSIS — H43823 Vitreomacular adhesion, bilateral: Secondary | ICD-10-CM | POA: Diagnosis not present

## 2020-10-05 DIAGNOSIS — H3540 Unspecified peripheral retinal degeneration: Secondary | ICD-10-CM | POA: Diagnosis not present

## 2020-10-05 DIAGNOSIS — D3132 Benign neoplasm of left choroid: Secondary | ICD-10-CM | POA: Diagnosis not present

## 2020-11-02 DIAGNOSIS — H35711 Central serous chorioretinopathy, right eye: Secondary | ICD-10-CM | POA: Diagnosis not present

## 2020-11-02 DIAGNOSIS — H43823 Vitreomacular adhesion, bilateral: Secondary | ICD-10-CM | POA: Diagnosis not present

## 2020-11-02 DIAGNOSIS — D3132 Benign neoplasm of left choroid: Secondary | ICD-10-CM | POA: Diagnosis not present

## 2020-12-06 DIAGNOSIS — I10 Essential (primary) hypertension: Secondary | ICD-10-CM | POA: Diagnosis not present

## 2020-12-06 DIAGNOSIS — G4700 Insomnia, unspecified: Secondary | ICD-10-CM | POA: Diagnosis not present

## 2020-12-06 DIAGNOSIS — E7849 Other hyperlipidemia: Secondary | ICD-10-CM | POA: Diagnosis not present

## 2020-12-06 DIAGNOSIS — Z6820 Body mass index (BMI) 20.0-20.9, adult: Secondary | ICD-10-CM | POA: Diagnosis not present

## 2021-07-06 ENCOUNTER — Ambulatory Visit: Payer: BC Managed Care – PPO | Admitting: Obstetrics and Gynecology

## 2021-07-11 NOTE — Progress Notes (Signed)
55 y.o. G63P3000 Single White or Caucasian Not Hispanic or Latino female here for annual exam.  H/O hysterectomy. No sexually active.  Has vasomotor symptoms, sometimes more bothersome than others. Avoiding triggers. Doesn't sleep well. Works night shift.    She started a statin with her primary.   Patient's last menstrual period was 02/16/2009.          Sexually active: No.  The current method of family planning is status post hysterectomy.    Exercising: No.  The patient does not participate in regular exercise at present. Smoker:  yes less than 1 PPD  Health Maintenance: Pap:  2010 wnl  History of abnormal Pap:  no MMG:   06/10/20 Bi-rads 1 neg, she will schedule.   BMD:   none  Colonoscopy: 06/01/20 F/u 3 years  TDaP:  04/08/13 Gardasil: na   reports that she has quit smoking. Her smoking use included cigarettes. She has never used smokeless tobacco. She reports that she does not drink alcohol and does not use drugs. She works in [employer], works night shift work. 3 sons. One son is a Engineer, structural. One son is in a 2 year rehab program, has over a year left. Other son works at [employer]. Kids are grown, 6 grandchildren, one on the way. All local. She has 3 dogs and a cat  Past Medical History:  Diagnosis Date   Colon polyps    Depression    Family history of adverse reaction to anesthesia    Pt mother has PONV   Fibroid    Heart murmur    as a child   Pneumonia    as a child   Wears glasses     Past Surgical History:  Procedure Laterality Date   BREAST BIOPSY     benign   COLONOSCOPY N/A 09/17/2019   Procedure: COLONOSCOPY;  Surgeon: Irving Copas., MD;  Location: [place];  Service: Gastroenterology;  Laterality: N/A;   COLONOSCOPY WITH PROPOFOL N/A 12/23/2018   Procedure: COLONOSCOPY WITH PROPOFOL;  Surgeon: Rush Landmark Telford Nab., MD;  Location: [place];  Service: Gastroenterology;  Laterality: N/A;  colonoscopy with EMR 90 minute case   COLONOSCOPY  WITH PROPOFOL N/A 06/01/2020   Procedure: COLONOSCOPY WITH PROPOFOL;  Surgeon: Gatha Mayer, MD;  [facility];  Service: Endoscopy;  Laterality: N/A;   ENDOSCOPIC MUCOSAL RESECTION  09/17/2019   Procedure: ENDOSCOPIC MUCOSAL RESECTION;  Surgeon: Rush Landmark, Telford Nab., MD;  Location: [place];  Service: Gastroenterology;;   FOREIGN BODY REMOVAL  12/23/2018   Procedure: FOREIGN BODY REMOVAL;  Surgeon: Irving Copas., MD;  Location: [place];  Service: Gastroenterology;;   LAPAROSCOPIC TOTAL HYSTERECTOMY  02/16/09   Robotic assisted   NOVASURE ABLATION     POLYPECTOMY  09/17/2019   Procedure: POLYPECTOMY;  Surgeon: Irving Copas., MD;  Location: [place];  Service: Gastroenterology;;   SUBMUCOSAL LIFTING INJECTION  09/17/2019   Procedure: SUBMUCOSAL LIFTING INJECTION;  Surgeon: Irving Copas., MD;  Location: [place];  Service: Gastroenterology;;   TUBAL LIGATION      Current Outpatient Medications  Medication Sig Dispense Refill   acetaminophen (TYLENOL) 500 MG tablet Take 1,000 mg by mouth every 8 (eight) hours as needed for moderate pain or headache.     pravastatin (PRAVACHOL) 10 MG tablet Take 10 mg by mouth at bedtime.     No current facility-administered medications for this visit.    Family History  Problem Relation Age of Onset   Rheum  arthritis Mother    Hypertension Mother    Alcoholism Father    Stroke Maternal Grandmother        mini strokes   Heart disease Maternal Grandfather        pacemaker   Diabetes Maternal Grandfather    Other Brother        vasculitis disease-unsure of all diag   Heart disease Paternal Grandfather    Leukemia Other    Colon cancer Neg Hx    Esophageal cancer Neg Hx    Rectal cancer Neg Hx    Stomach cancer Neg Hx    Inflammatory bowel disease Neg Hx    Liver disease Neg Hx    Pancreatic cancer Neg Hx     Review of Systems  All other systems reviewed and are negative.  Exam:   BP  130/80 (BP Location: Right Arm, Patient Position: Sitting, Cuff Size: Normal)   Pulse 81   Ht 5' 5.5" (1.664 m)   Wt 141 lb (64 kg)   LMP 02/16/2009   SpO2 100%   BMI 23.11 kg/m   Weight change: '@WEIGHTCHANGE'$ @ Height:   Height: 5' 5.5" (166.4 cm)  Ht Readings from Last 3 Encounters:  07/12/21 5' 5.5" (1.664 m)  06/30/20 '5\' 6"'$  (1.676 m)  06/01/20 '5\' 7"'$  (1.702 m)    General appearance: alert, cooperative and appears stated age Head: Normocephalic, without obvious abnormality, atraumatic Neck: no adenopathy, supple, symmetrical, trachea midline and thyroid normal to inspection and palpation Lungs: clear to auscultation bilaterally Cardiovascular: regular rate and rhythm Breasts: normal appearance, no masses or tenderness Abdomen: soft, non-tender; non distended,  no masses,  no organomegaly Extremities: extremities normal, atraumatic, no cyanosis or edema Skin: Skin color, texture, turgor normal. No rashes or lesions Lymph nodes: Cervical, supraclavicular, and axillary nodes normal. No abnormal inguinal nodes palpated Neurologic: Grossly normal   Pelvic: External genitalia:  no lesions              Urethra:  normal appearing urethra with no masses, tenderness or lesions              Bartholins and Skenes: normal                 Vagina: normal appearing vagina with normal color and discharge, no lesions              Cervix: absent               Bimanual Exam:  Uterus:  uterus absent              Adnexa: no mass, fullness, tenderness               Rectovaginal: Confirms               Anus:  normal sphincter tone, no lesions    1. Well woman exam Discussed breast self exam Discussed calcium and vit D intake Mammogram due, she will schedule Colonoscopy UTD She will get her labs with her primary Encouraged to quit smoking.

## 2021-07-12 ENCOUNTER — Other Ambulatory Visit: Payer: Self-pay

## 2021-07-12 ENCOUNTER — Ambulatory Visit: Payer: Self-pay | Admitting: Obstetrics and Gynecology

## 2021-07-12 ENCOUNTER — Ambulatory Visit (INDEPENDENT_AMBULATORY_CARE_PROVIDER_SITE_OTHER): Payer: 59 | Admitting: Obstetrics and Gynecology

## 2021-07-12 ENCOUNTER — Encounter: Payer: Self-pay | Admitting: Obstetrics and Gynecology

## 2021-07-12 VITALS — BP 130/80 | HR 81 | Ht 65.5 in | Wt 141.0 lb

## 2021-07-12 DIAGNOSIS — Z01419 Encounter for gynecological examination (general) (routine) without abnormal findings: Secondary | ICD-10-CM | POA: Diagnosis not present

## 2021-07-12 NOTE — Patient Instructions (Signed)

## 2021-08-25 ENCOUNTER — Encounter: Payer: Self-pay | Admitting: Obstetrics and Gynecology

## 2022-07-05 NOTE — Progress Notes (Deleted)
56 y.o. G77P3000 Single White or Caucasian Not Hispanic or Latino female here for annual exam.      Patient's last menstrual period was 02/16/2009.          Sexually active: {yes no:314532}  The current method of family planning is status post hysterectomy.    Exercising: {yes no:314532}  {types:19826} Smoker:  {YES [id number]  Health Maintenance: Pap:  2010 WNL  History of abnormal Pap:  no MMG:  08/25/21 density B Bi-rads 1 neg  BMD:   none  Colonoscopy: 06/01/20 normal f/u 10 years  TDaP:  04/08/13 Gardasil: n/a   reports that she has quit smoking. Her smoking use included cigarettes. She has never used smokeless tobacco. She reports that she does not drink alcohol and does not use drugs.  Past Medical History:  Diagnosis Date   Colon polyps    Depression    Family history of adverse reaction to anesthesia    Pt mother has PONV   Fibroid    Heart murmur    as a child   Pneumonia    as a child   Wears glasses     Past Surgical History:  Procedure Laterality Date   BREAST BIOPSY     benign   COLONOSCOPY N/A 09/17/2019   Procedure: COLONOSCOPY;  Surgeon: Rush Landmark Telford Nab., MD;  Location: [place];  Service: Gastroenterology;  Laterality: N/A;   COLONOSCOPY WITH PROPOFOL N/A 12/23/2018   Procedure: COLONOSCOPY WITH PROPOFOL;  Surgeon: Rush Landmark Telford Nab., MD;  Location: [place];  Service: Gastroenterology;  Laterality: N/A;  colonoscopy with EMR 90 minute case   COLONOSCOPY WITH PROPOFOL N/A 06/01/2020   Procedure: COLONOSCOPY WITH PROPOFOL;  Surgeon: Gatha Mayer, MD;  [facility];  Service: Endoscopy;  Laterality: N/A;   ENDOSCOPIC MUCOSAL RESECTION  09/17/2019   Procedure: ENDOSCOPIC MUCOSAL RESECTION;  Surgeon: Rush Landmark, Telford Nab., MD;  Location: [place];  Service: Gastroenterology;;   FOREIGN BODY REMOVAL  12/23/2018   Procedure: FOREIGN BODY REMOVAL;  Surgeon: Irving Copas., MD;  Location: [place];  Service:  Gastroenterology;;   LAPAROSCOPIC TOTAL HYSTERECTOMY  02/16/09   Robotic assisted   NOVASURE ABLATION     POLYPECTOMY  09/17/2019   Procedure: POLYPECTOMY;  Surgeon: Irving Copas., MD;  Location: [place];  Service: Gastroenterology;;   SUBMUCOSAL LIFTING INJECTION  09/17/2019   Procedure: SUBMUCOSAL LIFTING INJECTION;  Surgeon: Irving Copas., MD;  Location: [place];  Service: Gastroenterology;;   TUBAL LIGATION      Current Outpatient Medications  Medication Sig Dispense Refill   acetaminophen (TYLENOL) 500 MG tablet Take 1,000 mg by mouth every 8 (eight) hours as needed for moderate pain or headache.     pravastatin (PRAVACHOL) 10 MG tablet Take 10 mg by mouth at bedtime.     No current facility-administered medications for this visit.    Family History  Problem Relation Age of Onset   Rheum arthritis Mother    Hypertension Mother    Alcoholism Father    Stroke Maternal Grandmother        mini strokes   Heart disease Maternal Grandfather        pacemaker   Diabetes Maternal Grandfather    Other Brother        vasculitis disease-unsure of all diag   Heart disease Paternal Grandfather    Leukemia Other    Colon cancer Neg Hx    Esophageal cancer Neg Hx    Rectal cancer Neg Hx  Stomach cancer Neg Hx    Inflammatory bowel disease Neg Hx    Liver disease Neg Hx    Pancreatic cancer Neg Hx     Review of Systems  Exam:   LMP 02/16/2009   Weight change: '@WEIGHTCHANGE'$ @ Height:      Ht Readings from Last 3 Encounters:  07/12/21 5' 5.5" (1.664 m)  06/30/20 '5\' 6"'$  (1.676 m)  06/01/20 '5\' 7"'$  (1.702 m)    General appearance: alert, cooperative and appears stated age Head: Normocephalic, without obvious abnormality, atraumatic Neck: no adenopathy, supple, symmetrical, trachea midline and thyroid {CHL AMB PHY EX THYROID NORM [phone]::"normal to inspection and palpation"} Lungs: clear to auscultation bilaterally Cardiovascular: regular rate  and rhythm Breasts: {Exam; breast:13139::"normal appearance, no masses or tenderness"} Abdomen: soft, non-tender; non distended,  no masses,  no organomegaly Extremities: extremities normal, atraumatic, no cyanosis or edema Skin: Skin color, texture, turgor normal. No rashes or lesions Lymph nodes: Cervical, supraclavicular, and axillary nodes normal. No abnormal inguinal nodes palpated Neurologic: Grossly normal   Pelvic: External genitalia:  no lesions              Urethra:  normal appearing urethra with no masses, tenderness or lesions              Bartholins and Skenes: normal                 Vagina: normal appearing vagina with normal color and discharge, no lesions              Cervix: {CHL AMB PHY EX CERVIX NORM DEFAULT:[phone]::"no lesions"}               Bimanual Exam:  Uterus:  {CHL AMB PHY EX UTERUS NORM [phone]::"normal size, contour, position, consistency, mobility, non-tender"}              Adnexa: {CHL AMB PHY EX ADNEXA NO MASS DEFAULT:[phone]::"no mass, fullness, tenderness"}               Rectovaginal: Confirms               Anus:  normal sphincter tone, no lesions  *** chaperoned for the exam.  A:  Well Woman with normal exam  P:

## 2022-07-13 ENCOUNTER — Ambulatory Visit: Payer: 59 | Admitting: Obstetrics and Gynecology

## 2022-08-08 NOTE — Progress Notes (Signed)
56 y.o. G59P3000 Single White or Caucasian Not Hispanic or Latino female here for annual exam.   H/O hysterectomy. No sexually active.    No bowel or bladder c/o.  Patient's last menstrual period was 02/16/2009.          Sexually active: No.  The current method of family planning is status post hysterectomy.    Exercising: No.   Smoker:  yes less than 1 PPD, wants to quit. Has spoken with her primary  Health Maintenance: Pap:  2010 wnl History of abnormal Pap:  no MMG:  08/22/21 WNL, screening 1 year BMD:   none Colonoscopy: 05/22/20, repeat 3 years TDaP:  04/08/13 Gardasil: na   reports that she has been smoking cigarettes. She has been smoking an average of 1 pack per day. She has never used smokeless tobacco. She reports that she does not drink alcohol and does not use drugs. She works night shift in a factory. Has 3 grown sons, 7 grandchildren. All local. She has 3 dogs and a cat.    Past Medical History:  Diagnosis Date   Colon polyps    Depression    Family history of adverse reaction to anesthesia    Pt mother has PONV   Fibroid    Heart murmur    as a child   Pneumonia    as a child   Wears glasses     Past Surgical History:  Procedure Laterality Date   BREAST BIOPSY     benign   COLONOSCOPY N/A 09/17/2019   Procedure: COLONOSCOPY;  Surgeon: Rush Landmark Telford Nab., MD;  Location: [place];  Service: Gastroenterology;  Laterality: N/A;   COLONOSCOPY WITH PROPOFOL N/A 12/23/2018   Procedure: COLONOSCOPY WITH PROPOFOL;  Surgeon: Rush Landmark Telford Nab., MD;  Location: [place];  Service: Gastroenterology;  Laterality: N/A;  colonoscopy with EMR 90 minute case   COLONOSCOPY WITH PROPOFOL N/A 06/01/2020   Procedure: COLONOSCOPY WITH PROPOFOL;  Surgeon: Gatha Mayer, MD;  [facility];  Service: Endoscopy;  Laterality: N/A;   ENDOSCOPIC MUCOSAL RESECTION  09/17/2019   Procedure: ENDOSCOPIC MUCOSAL RESECTION;  Surgeon: Rush Landmark, Telford Nab., MD;   Location: [place];  Service: Gastroenterology;;   FOREIGN BODY REMOVAL  12/23/2018   Procedure: FOREIGN BODY REMOVAL;  Surgeon: Irving Copas., MD;  Location: [place];  Service: Gastroenterology;;   LAPAROSCOPIC TOTAL HYSTERECTOMY  02/16/09   Robotic assisted   NOVASURE ABLATION     POLYPECTOMY  09/17/2019   Procedure: POLYPECTOMY;  Surgeon: Irving Copas., MD;  Location: [place];  Service: Gastroenterology;;   SUBMUCOSAL LIFTING INJECTION  09/17/2019   Procedure: SUBMUCOSAL LIFTING INJECTION;  Surgeon: Irving Copas., MD;  [facility];  Service: Gastroenterology;;   TUBAL LIGATION      Current Outpatient Medications  Medication Sig Dispense Refill   Cyanocobalamin (B-12 PO) Take by mouth.     IBUPROFEN PO Take by mouth.     Omega-3 Fatty Acids (FISH OIL) 1000 MG CAPS Take by mouth.     No current facility-administered medications for this visit.    Family History  Problem Relation Age of Onset   Rheum arthritis Mother    Hypertension Mother    Alcoholism Father    Stroke Maternal Grandmother        mini strokes   Heart disease Maternal Grandfather        pacemaker   Diabetes Maternal Grandfather    Other Brother        vasculitis  disease-unsure of all diag   Heart disease Paternal Grandfather    Leukemia Other    Colon cancer Neg Hx    Esophageal cancer Neg Hx    Rectal cancer Neg Hx    Stomach cancer Neg Hx    Inflammatory bowel disease Neg Hx    Liver disease Neg Hx    Pancreatic cancer Neg Hx     Review of Systems  All other systems reviewed and are negative.   Exam:   BP 116/80 (BP Location: Left Arm, Patient Position: Sitting, Cuff Size: Normal)   Pulse 76   Ht 5' 5.5" (1.664 m)   Wt 140 lb (63.5 kg)   LMP 02/16/2009   SpO2 96%   BMI 22.94 kg/m   Weight change: '@WEIGHTCHANGE'$ @ Height:   Height: 5' 5.5" (166.4 cm)  Ht Readings from Last 3 Encounters:  08/11/22 5' 5.5" (1.664 m)  07/12/21 5' 5.5" (1.664 m)   06/30/20 '5\' 6"'$  (1.676 m)    General appearance: alert, cooperative and appears stated age Head: Normocephalic, without obvious abnormality, atraumatic Neck: no adenopathy, supple, symmetrical, trachea midline and thyroid normal to inspection and palpation Lungs: clear to auscultation bilaterally Cardiovascular: regular rate and rhythm Breasts: normal appearance, no masses or tenderness Abdomen: soft, non-tender; non distended,  no masses,  no organomegaly Extremities: extremities normal, atraumatic, no cyanosis or edema Skin: Skin color, texture, turgor normal. No rashes or lesions Lymph nodes: Cervical, supraclavicular, and axillary nodes normal. No abnormal inguinal nodes palpated Neurologic: Grossly normal   Pelvic: External genitalia:  no lesions              Urethra:  normal appearing urethra with no masses, tenderness or lesions              Bartholins and Skenes: normal                 Vagina: atrophic appearing vagina with normal color and discharge, no lesions              Cervix: absent               Bimanual Exam:  Uterus:  uterus absent              Adnexa: no mass, fullness, tenderness               Rectovaginal: Confirms               Anus:  normal sphincter tone, no lesions  Santiago Glad, CMA chaperoned for the exam.  1. Well woman exam Discussed breast self exam Discussed calcium and vit D intake Mammogram due next month, she will schedule Colonoscopy due next year Labs with primary Discussed quitting smoking

## 2022-08-11 ENCOUNTER — Ambulatory Visit (INDEPENDENT_AMBULATORY_CARE_PROVIDER_SITE_OTHER): Payer: 59 | Admitting: Obstetrics and Gynecology

## 2022-08-11 ENCOUNTER — Encounter: Payer: Self-pay | Admitting: Obstetrics and Gynecology

## 2022-08-11 VITALS — BP 116/80 | HR 76 | Ht 65.5 in | Wt 140.0 lb

## 2022-08-11 DIAGNOSIS — Z01419 Encounter for gynecological examination (general) (routine) without abnormal findings: Secondary | ICD-10-CM | POA: Diagnosis not present

## 2022-08-11 NOTE — Patient Instructions (Signed)

## 2023-08-13 ENCOUNTER — Encounter: Payer: Self-pay | Admitting: Adult Health

## 2023-08-13 ENCOUNTER — Ambulatory Visit (INDEPENDENT_AMBULATORY_CARE_PROVIDER_SITE_OTHER): Payer: 59 | Admitting: Adult Health

## 2023-08-13 VITALS — BP 160/86 | HR 65 | Ht 66.5 in | Wt 147.0 lb

## 2023-08-13 DIAGNOSIS — Z1330 Encounter for screening examination for mental health and behavioral disorders, unspecified: Secondary | ICD-10-CM

## 2023-08-13 DIAGNOSIS — R030 Elevated blood-pressure reading, without diagnosis of hypertension: Secondary | ICD-10-CM | POA: Insufficient documentation

## 2023-08-13 DIAGNOSIS — F17200 Nicotine dependence, unspecified, uncomplicated: Secondary | ICD-10-CM | POA: Insufficient documentation

## 2023-08-13 DIAGNOSIS — Z1211 Encounter for screening for malignant neoplasm of colon: Secondary | ICD-10-CM | POA: Insufficient documentation

## 2023-08-13 DIAGNOSIS — Z01419 Encounter for gynecological examination (general) (routine) without abnormal findings: Secondary | ICD-10-CM | POA: Diagnosis not present

## 2023-08-13 DIAGNOSIS — Z90710 Acquired absence of both cervix and uterus: Secondary | ICD-10-CM | POA: Insufficient documentation

## 2023-08-13 LAB — HEMOCCULT GUIAC POC 1CARD (OFFICE): Fecal Occult Blood, POC: NEGATIVE

## 2023-08-13 NOTE — Progress Notes (Signed)
Patient ID: Kathryn Church, female   DOB: September 16, 1966, 57 y.o.   MRN: [id number] History of Present Illness: Kathryn Church is a 57 year old white female,single, sp hysterectomy in for a well woman gyn exam. She is a new pt. She works at [employer] and cares for her mom, has only had 2 days off in last month.  PCP is Dr Olena Leatherwood   Current Medications, Allergies, Past Medical History, Past Surgical History, Family History and Social History were reviewed in [employer] [facility] electronic medical record.     Review of Systems: Patient denies any headaches, hearing loss, fatigue, blurred vision, shortness of breath, chest pain, abdominal pain, problems with swallowing, bowel movements, urination, or intercourse(not active). No joint pain or mood swings.     Physical Exam:BP (!) 160/86 (BP Location: Right Arm, Patient Position: Sitting, Cuff Size: Normal)   Pulse 65   Ht 5' 6.5" (1.689 m)   Wt 147 lb (66.7 kg)   LMP 02/16/2009   BMI 23.37 kg/m   General:  Well developed, well nourished, no acute distress Skin:  Warm and dry Neck:  Midline trachea, normal thyroid, good ROM, no lymphadenopathy Lungs; Clear to auscultation bilaterally Breast:  No dominant palpable mass, retraction, or nipple discharge Cardiovascular: Regular rate and rhythm Abdomen:  Soft, non tender, no hepatosplenomegaly Pelvic:  External genitalia is normal in appearance, no lesions.  The vagina is pale. Urethra has no lesions or masses. The cervix and uterus are absent.  No adnexal masses or tenderness noted.Bladder is non tender, no masses felt. Rectal: Good sphincter tone, no polyps, or hemorrhoids felt.  Hemoccult negative. Extremities/musculoskeletal:  No swelling or varicosities noted, no clubbing or cyanosis Psych:  No mood changes, alert and cooperative,seems happy AA is 0 Fall risk is low    08/13/2023    8:37 AM  Depression screen PHQ 2/9  Decreased Interest 0  Down, Depressed, Hopeless 0  PHQ - 2 Score 0  Altered sleeping 0   Tired, decreased energy 0  Change in appetite 0  Feeling bad or failure about yourself  0  Trouble concentrating 0  Moving slowly or fidgety/restless 0  Suicidal thoughts 0  PHQ-9 Score 0       08/13/2023    8:38 AM  GAD 7 : Generalized Anxiety Score  Nervous, Anxious, on Edge 0  Control/stop worrying 0  Worry too much - different things 0  Trouble relaxing 0  Restless 0  Easily annoyed or irritable 0  Afraid - awful might happen 0  Total GAD 7 Score 0    Upstream - 08/13/23 0840       Pregnancy Intention Screening   Does the patient want to become pregnant in the next year? No    Does the patient's partner want to become pregnant in the next year? N/A    Would the patient like to discuss contraceptive options today? No      Contraception Wrap Up   Current Method Female Sterilization   hysterectomy   End Method Female Sterilization   hysterectomy   Contraception Counseling Provided No              Examination chaperoned by Sherri  RN  Impression and Plan: 1. Encounter for well woman exam with routine gynecological exam Physical in 1 year Labs with PCP Colonoscopy per GI Mammogram was negative 07/03/23  2. Encounter for screening fecal occult blood testing Hemoccult was negative   3. Elevated BP without diagnosis of hypertension Keep check on  BP  Decrease salt and sugars, fast food and processed foods Follow up with PCP  4. Smoker Try to decrease smoking   5. S/P hysterectomy

## 2023-08-16 ENCOUNTER — Ambulatory Visit: Payer: 59 | Admitting: Obstetrics and Gynecology

## 2023-11-15 ENCOUNTER — Telehealth: Payer: Self-pay | Admitting: Gastroenterology

## 2023-11-15 NOTE — Telephone Encounter (Signed)
 This is a Dr Leone Payor pt will send to [facility]

## 2023-11-15 NOTE — Telephone Encounter (Signed)
 Patient called to schedule a recall colonoscopy at the hospital.

## 2023-11-15 NOTE — Telephone Encounter (Signed)
 It is okay to schedule a repeat colonoscopy.  I think it is okay to have it done in the [facility] but if she wants to do it at the hospital that is okay.

## 2023-11-16 NOTE — Telephone Encounter (Signed)
 Left message for patient to call back

## 2023-11-19 NOTE — Telephone Encounter (Signed)
 Patient called stating she would like to do her colonoscopy at the hospital, requesting a call back before 1:30 PM if possible.

## 2023-11-20 ENCOUNTER — Other Ambulatory Visit: Payer: Self-pay

## 2023-11-20 DIAGNOSIS — Z1211 Encounter for screening for malignant neoplasm of colon: Secondary | ICD-10-CM

## 2023-11-20 DIAGNOSIS — Z860100 Personal history of colon polyps, unspecified: Secondary | ICD-10-CM

## 2023-11-20 NOTE — Telephone Encounter (Signed)
 Pt made aware of Dr. Avram recommendations: Pt was ordered and scheduled for the colonoscopy on 12/27/2023 at 8:15 AM at [facility] with Dr. Avram. Pt made aware. Case ID [id number]. Pt was scheduled for a previsit on 12/10/2023 at 3:30 PM. Pt made aware. Pt verbalized understanding with all questions answered.

## 2023-12-10 ENCOUNTER — Ambulatory Visit (AMBULATORY_SURGERY_CENTER): Payer: 59

## 2023-12-10 VITALS — Ht 66.5 in | Wt 148.4 lb

## 2023-12-10 DIAGNOSIS — Z860100 Personal history of colon polyps, unspecified: Secondary | ICD-10-CM

## 2023-12-10 MED ORDER — SUTAB 1479-225-188 MG PO TABS: 12.0000 | ORAL_TABLET | ORAL | 0 refills | Status: DC

## 2023-12-10 NOTE — Progress Notes (Signed)
No egg or soy allergy known to patient  No issues known to pt with past sedation with any surgeries or procedures Patient denies ever being told they had issues or difficulty with intubation  No FH of Malignant Hyperthermia Pt is not on diet pills Pt is not on  home 02  Pt is not on blood thinners  Pt denies issues with constipation  No A fib or A flutter Have any cardiac testing pending-- no  LOA: independent  Prep: sutab  Patient's chart reviewed by Cathlyn Parsons CNRA prior to previsit and patient appropriate for the [facility].  Previsit completed and red dot placed by patient's name on their procedure day (on provider's schedule).     PV completed with patient. Prep instructions sent via mychart and home address.

## 2023-12-17 ENCOUNTER — Encounter: Payer: Self-pay | Admitting: Internal Medicine

## 2023-12-26 NOTE — Anesthesia Preprocedure Evaluation (Signed)
Anesthesia Evaluation  Patient identified by MRN, date of birth, ID band Patient awake    Reviewed: Allergy & Precautions, NPO status , Patient's Chart, lab work & pertinent test results  Airway Mallampati: II  TM Distance: >3 FB Neck ROM: Full    Dental no notable dental hx. (+) Teeth Intact, Dental Advisory Given   Pulmonary Current SmokerPatient did not abstain from smoking.   Pulmonary exam normal breath sounds clear to auscultation       Cardiovascular hypertension, Normal cardiovascular exam Rhythm:Regular Rate:Normal     Neuro/Psych  PSYCHIATRIC DISORDERS  Depression    negative neurological ROS     GI/Hepatic negative GI ROS, Neg liver ROS,,,  Endo/Other  negative endocrine ROS    Renal/GU negative Renal ROS     Musculoskeletal negative musculoskeletal ROS (+)    Abdominal   Peds  Hematology negative hematology ROS (+)   Anesthesia Other Findings   Reproductive/Obstetrics                             Anesthesia Physical Anesthesia Plan  ASA: 2  Anesthesia Plan: MAC   Post-op Pain Management:    Induction:   PONV Risk Score and Plan: Propofol infusion and Treatment may vary due to age or medical condition  Airway Management Planned: Natural Airway and Nasal Cannula  Additional Equipment: None  Intra-op Plan:   Post-operative Plan:   Informed Consent: I have reviewed the patients History and Physical, chart, labs and discussed the procedure including the risks, benefits and alternatives for the proposed anesthesia with the patient or authorized representative who has indicated his/her understanding and acceptance.     Dental advisory given  Plan Discussed with:   Anesthesia Plan Comments: (Hx of polypsfor colonoscopy)       Anesthesia Quick Evaluation

## 2023-12-27 ENCOUNTER — Ambulatory Visit (HOSPITAL_COMMUNITY)
Admission: RE | Admit: 2023-12-27 | Discharge: 2023-12-27 | Disposition: A | Discharge status: Home / Self Care | Payer: 59 | Attending: Internal Medicine | Admitting: Internal Medicine

## 2023-12-27 ENCOUNTER — Ambulatory Visit (HOSPITAL_BASED_OUTPATIENT_CLINIC_OR_DEPARTMENT_OTHER): Payer: 59 | Admitting: Anesthesiology

## 2023-12-27 ENCOUNTER — Other Ambulatory Visit: Payer: Self-pay

## 2023-12-27 ENCOUNTER — Encounter (HOSPITAL_COMMUNITY): Payer: Self-pay | Admitting: Internal Medicine

## 2023-12-27 ENCOUNTER — Encounter (HOSPITAL_COMMUNITY)
Admission: RE | Disposition: A | Discharge status: Home / Self Care | Payer: Self-pay | Source: Home / Self Care | Attending: Internal Medicine

## 2023-12-27 ENCOUNTER — Ambulatory Visit (HOSPITAL_COMMUNITY): Payer: 59 | Admitting: Anesthesiology

## 2023-12-27 DIAGNOSIS — Z1211 Encounter for screening for malignant neoplasm of colon: Secondary | ICD-10-CM | POA: Diagnosis not present

## 2023-12-27 DIAGNOSIS — D122 Benign neoplasm of ascending colon: Secondary | ICD-10-CM

## 2023-12-27 DIAGNOSIS — Z860101 Personal history of adenomatous and serrated colon polyps: Secondary | ICD-10-CM

## 2023-12-27 DIAGNOSIS — D124 Benign neoplasm of descending colon: Secondary | ICD-10-CM | POA: Diagnosis not present

## 2023-12-27 DIAGNOSIS — K644 Residual hemorrhoidal skin tags: Secondary | ICD-10-CM | POA: Diagnosis not present

## 2023-12-27 DIAGNOSIS — I10 Essential (primary) hypertension: Secondary | ICD-10-CM | POA: Insufficient documentation

## 2023-12-27 DIAGNOSIS — Z860100 Personal history of colon polyps, unspecified: Secondary | ICD-10-CM

## 2023-12-27 DIAGNOSIS — F17210 Nicotine dependence, cigarettes, uncomplicated: Secondary | ICD-10-CM | POA: Diagnosis not present

## 2023-12-27 DIAGNOSIS — Z09 Encounter for follow-up examination after completed treatment for conditions other than malignant neoplasm: Secondary | ICD-10-CM | POA: Diagnosis present

## 2023-12-27 HISTORY — PX: COLONOSCOPY WITH PROPOFOL: SHX5780

## 2023-12-27 HISTORY — PX: POLYPECTOMY: SHX5525

## 2023-12-27 SURGERY — COLONOSCOPY WITH PROPOFOL
Anesthesia: Monitor Anesthesia Care

## 2023-12-27 MED ORDER — SODIUM CHLORIDE 0.9 % IV SOLN: INTRAVENOUS | Status: DC | PRN

## 2023-12-27 MED ORDER — PROPOFOL 500 MG/50ML IV EMUL
INTRAVENOUS | Status: DC | PRN
  Administered 2023-12-27: 100 ug/kg/min via INTRAVENOUS
  Administered 2023-12-27 (×2): 50 mg via INTRAVENOUS

## 2023-12-27 MED ORDER — EPHEDRINE SULFATE (PRESSORS) 50 MG/ML IJ SOLN
INTRAMUSCULAR | Status: DC | PRN
  Administered 2023-12-27: 5 mg via INTRAVENOUS

## 2023-12-27 MED ORDER — LIDOCAINE 2% (20 MG/ML) 5 ML SYRINGE
INTRAMUSCULAR | Status: DC | PRN
  Administered 2023-12-27: 50 mg via INTRAVENOUS

## 2023-12-27 SURGICAL SUPPLY — 20 items
ELECT REM PT RETURN 9FT ADLT (ELECTROSURGICAL)
ELECTRODE REM PT RTRN 9FT ADLT (ELECTROSURGICAL) IMPLANT
FLOOR PAD 36X40 (MISCELLANEOUS) ×2
FORCEPS BIOP RAD 4 LRG CAP 4 (CUTTING FORCEPS) IMPLANT
FORCEPS BIOP RJ4 240 W/NDL (CUTTING FORCEPS)
FORCEPS BXJMBJMB 240X2.8X (CUTTING FORCEPS) IMPLANT
INJECTOR/SNARE I SNARE (MISCELLANEOUS) IMPLANT
LUBRICANT JELLY 4.5OZ STERILE (MISCELLANEOUS) IMPLANT
MANIFOLD NEPTUNE II (INSTRUMENTS) IMPLANT
NDL SCLEROTHERAPY 25GX240 (NEEDLE) IMPLANT
NEEDLE SCLEROTHERAPY 25GX240 (NEEDLE)
PAD FLOOR 36X40 (MISCELLANEOUS) ×3 IMPLANT
PROBE APC STR FIRE (PROBE) IMPLANT
PROBE INJECTION GOLD 7FR (MISCELLANEOUS) IMPLANT
SNARE ROTATE MED OVAL 20MM (MISCELLANEOUS) IMPLANT
SYR 50ML LL SCALE MARK (SYRINGE) IMPLANT
TRAP SPECIMEN MUCOUS 40CC (MISCELLANEOUS) IMPLANT
TUBING ENDO SMARTCAP PENTAX (MISCELLANEOUS) IMPLANT
TUBING IRRIGATION ENDOGATOR (MISCELLANEOUS) ×3 IMPLANT
WATER STERILE IRR 1000ML POUR (IV SOLUTION) IMPLANT

## 2023-12-27 NOTE — H&P (Signed)
[facility] History and Physical   Primary Care Physician:  Toma Deiters, MD   Reason for Procedure:   Hx colon polyps  Plan:    colonoscopy     HPI: Kathryn Church is a 58 y.o. female w/ hx colon polyps for surveillance exam.  Has had multiple colonoscopy procedures to remove large cecal adenomatous polyp. Last exam 2021 no residual polyp   Past Medical History:  Diagnosis Date   Colon polyps    Depression    Family history of adverse reaction to anesthesia    Pt mother has PONV   Fibroid    Heart murmur    as a child   Hypertension    Pneumonia    as a child   Wears glasses     Past Surgical History:  Procedure Laterality Date   BREAST BIOPSY     benign   COLONOSCOPY N/A 09/17/2019   Procedure: COLONOSCOPY;  Surgeon: Lemar Lofty., MD;  Location: [facility] ENDOSCOPY;  Service: Gastroenterology;  Laterality: N/A;   COLONOSCOPY WITH PROPOFOL N/A 12/23/2018   Procedure: COLONOSCOPY WITH PROPOFOL;  Surgeon: Meridee Score Netty Starring., MD;  Location: [facility] ENDOSCOPY;  Service: Gastroenterology;  Laterality: N/A;  colonoscopy with EMR 90 minute case   COLONOSCOPY WITH PROPOFOL N/A 06/01/2020   Procedure: COLONOSCOPY WITH PROPOFOL;  Surgeon: Iva Boop, MD;  Location: [facility] ENDOSCOPY;  Service: Endoscopy;  Laterality: N/A;   ENDOSCOPIC MUCOSAL RESECTION  09/17/2019   Procedure: ENDOSCOPIC MUCOSAL RESECTION;  Surgeon: Meridee Score, Netty Starring., MD;  Location: [facility] ENDOSCOPY;  Service: Gastroenterology;;   FOREIGN BODY REMOVAL  12/23/2018   Procedure: FOREIGN BODY REMOVAL;  Surgeon: Lemar Lofty., MD;  Location: [facility] ENDOSCOPY;  Service: Gastroenterology;;   LAPAROSCOPIC TOTAL HYSTERECTOMY  02/16/09   Robotic assisted   NOVASURE ABLATION     POLYPECTOMY  09/17/2019   Procedure: POLYPECTOMY;  Surgeon: Lemar Lofty., MD;  Location: [facility] ENDOSCOPY;  Service: Gastroenterology;;   SUBMUCOSAL LIFTING INJECTION  09/17/2019   Procedure: SUBMUCOSAL LIFTING  INJECTION;  Surgeon: Lemar Lofty., MD;  Location: [facility] ENDOSCOPY;  Service: Gastroenterology;;   TUBAL LIGATION      Prior to Admission medications   Medication Sig Start Date End Date Taking? Authorizing Provider  acetaminophen (TYLENOL) 500 MG tablet Take 1,000 mg by mouth daily.   Yes [provider]  ketorolac (ACULAR) 0.5 % ophthalmic solution Place 1 drop into the right eye 3 (three) times daily. 11/19/23  Yes [provider]  olmesartan (BENICAR) 5 MG tablet Take 5 mg by mouth daily. 11/22/23  Yes [provider]  Omega-3 Fatty Acids (FISH OIL) 1000 MG CAPS Take by mouth.   Yes [provider]  Sodium Sulfate-Mag Sulfate-KCl (SUTAB) [phone] MG TABS Take 12 tablets by mouth as directed. 12/10/23  Yes Iva Boop, MD    No current facility-administered medications for this encounter.    Allergies as of 11/20/2023   (No Known Allergies)    Family History  Problem Relation Age of Onset   Rheum arthritis Mother    Hypertension Mother    Alcoholism Father    Stroke Maternal Grandmother        mini strokes   Heart disease Maternal Grandfather        pacemaker   Diabetes Maternal Grandfather    Other Brother        vasculitis disease-unsure of all diag   Heart disease Paternal Grandfather    Leukemia Other    Colon cancer  Neg Hx    Esophageal cancer Neg Hx    Rectal cancer Neg Hx    Stomach cancer Neg Hx    Inflammatory bowel disease Neg Hx    Liver disease Neg Hx    Pancreatic cancer Neg Hx     Social History   Socioeconomic History   Marital status: Single    Spouse name: Not on file   Number of children: 3   Years of education: Not on file   Highest education level: Not on file  Occupational History   Not on file  Tobacco Use   Smoking status: Every Day    Current packs/day: 1.00    Types: Cigarettes   Smokeless tobacco: Never  Vaping Use   Vaping status: Never Used  Substance and Sexual Activity   Alcohol  use: No   Drug use: No   Sexual activity: Not Currently    Partners: Male    Birth control/protection: Surgical    Comment: TLH  Other Topics Concern   Not on file  Social History Narrative   Not on file   Social Drivers of Health   Financial Resource Strain: Low Risk  (08/13/2023)   Overall Financial Resource Strain (CARDIA)    Difficulty of Paying Living Expenses: Not hard at all  Food Insecurity: No Food Insecurity (08/13/2023)   Hunger Vital Sign    Worried About Running Out of Food in the Last Year: Never true    Ran Out of Food in the Last Year: Never true  Transportation Needs: No Transportation Needs (08/13/2023)   PRAPARE - Administrator, Civil Service (Medical): No    Lack of Transportation (Non-Medical): No  Physical Activity: Inactive (08/13/2023)   Exercise Vital Sign    Days of Exercise per Week: 0 days    Minutes of Exercise per Session: 0 min  Stress: No Stress Concern Present (08/13/2023)   [employer] of Occupational Health - Occupational Stress Questionnaire    Feeling of Stress : Not at all  Social Connections: Moderately Isolated (08/13/2023)   Social Connection and Isolation Panel [NHANES]    Frequency of Communication with Friends and Family: More than three times a week    Frequency of Social Gatherings with Friends and Family: Three times a week    Attends Religious Services: More than 4 times per year    Active Member of Clubs or Organizations: No    Attends Banker Meetings: Never    Marital Status: Divorced  Catering manager Violence: Not At Risk (08/13/2023)   Humiliation, Afraid, Rape, and Kick questionnaire    Fear of Current or Ex-Partner: No    Emotionally Abused: No    Physically Abused: No    Sexually Abused: No    Review of Systems:  All other review of systems negative except as mentioned in the HPI.  Physical Exam: Vital signs BP 126/86   Pulse 81   Temp (!) 97.2 F (36.2 C) (Temporal)   Resp 16    Ht 5\' 7"  (1.702 m)   Wt 66.2 kg   LMP 02/16/2009   SpO2 98%   BMI 22.87 kg/m   General:   Alert,  Well-developed, well-nourished, pleasant and cooperative in NAD Lungs:  Clear throughout to auscultation.   Heart:  Regular rate and rhythm; no murmurs, clicks, rubs,  or gallops. Abdomen:  Soft, nontender and nondistended. Normal bowel sounds.   Neuro/Psych:  Alert and cooperative. Normal mood and affect. A and O  x 3   @Blakelynn Scheeler  Sena Slate, MD, [facility] [phone] (pager) 12/27/2023 7:55 AM@

## 2023-12-27 NOTE — Transfer of Care (Signed)
Immediate Anesthesia Transfer of Care Note  Patient: Kathryn Church  Procedure(s) Performed: COLONOSCOPY WITH PROPOFOL POLYPECTOMY  Patient Location: Endoscopy Unit  Anesthesia Type:MAC  Level of Consciousness: awake, alert , and oriented  Airway & Oxygen Therapy: Patient Spontanous Breathing  Post-op Assessment: Report given to RN and Post -op Vital signs reviewed and stable  Post vital signs: Reviewed and stable  Last Vitals:  Vitals Value Taken Time  BP 108/73 12/27/23 0833  Temp    Pulse 75 12/27/23 0835  Resp 15 12/27/23 0835  SpO2 98 % 12/27/23 0835  Vitals shown include unfiled device data.  Last Pain:  Vitals:   12/27/23 0706  TempSrc: Temporal  PainSc: 0-No pain         Complications: No notable events documented.

## 2023-12-27 NOTE — Anesthesia Postprocedure Evaluation (Signed)
Anesthesia Post Note  Patient: Kathryn Church  Procedure(s) Performed: COLONOSCOPY WITH PROPOFOL POLYPECTOMY     Patient location during evaluation: Endoscopy Anesthesia Type: MAC Level of consciousness: awake and alert Pain management: pain level controlled Vital Signs Assessment: post-procedure vital signs reviewed and stable Respiratory status: spontaneous breathing, nonlabored ventilation, respiratory function stable and patient connected to nasal cannula oxygen Cardiovascular status: blood pressure returned to baseline and stable Postop Assessment: no apparent nausea or vomiting Anesthetic complications: no  No notable events documented.  Last Vitals:  Vitals:   12/27/23 0850 12/27/23 0900  BP: 114/72 (!) 114/91  Pulse: 67 69  Resp: 13 17  Temp:    SpO2: 99% 100%    Last Pain:  Vitals:   12/27/23 0900  TempSrc:   PainSc: 0-No pain                 Trevor Iha

## 2023-12-27 NOTE — Discharge Instructions (Addendum)
I found and removed 2 tiny polyps. No evidence of regrowth of the large polyp.  I will let you know pathology results and when to have another routine colonoscopy by mail and/or My Chart.  I appreciate the opportunity to care for you. Iva Boop, MD, FACG     YOU HAD AN ENDOSCOPIC PROCEDURE TODAY: Refer to the procedure report and other information in the discharge instructions given to you for any specific questions about what was found during the examination. If this information does not answer your questions, please call [place] office at [phone] to clarify.   YOU SHOULD EXPECT: Some feelings of bloating in the abdomen. Passage of more gas than usual. Walking can help get rid of the air that was put into your GI tract during the procedure and reduce the bloating. If you had a lower endoscopy (such as a colonoscopy or flexible sigmoidoscopy) you may notice spotting of blood in your stool or on the toilet paper. Some abdominal soreness may be present for a day or two, also.  DIET: Your first meal following the procedure should be a light meal and then it is ok to progress to your normal diet. A half-sandwich or bowl of soup is an example of a good first meal. Heavy or fried foods are harder to digest and may make you feel nauseous or bloated. Drink plenty of fluids but you should avoid alcoholic beverages for 24 hours. If you had a esophageal dilation, please see attached instructions for diet.    ACTIVITY: Your care partner should take you home directly after the procedure. You should plan to take it easy, moving slowly for the rest of the day. You can resume normal activity the day after the procedure however YOU SHOULD NOT DRIVE, use power tools, machinery or perform tasks that involve climbing or major physical exertion for 24 hours (because of the sedation medicines used during the test).   SYMPTOMS TO REPORT IMMEDIATELY: A gastroenterologist can be reached at any hour. Please  call [phone]  for any of the following symptoms:  Following lower endoscopy (colonoscopy, flexible sigmoidoscopy) Excessive amounts of blood in the stool  Significant tenderness, worsening of abdominal pains  Swelling of the abdomen that is new, acute  Fever of 100 or higher   FOLLOW UP:  If any biopsies were taken you will be contacted by phone or by letter within the next 1-3 weeks. Call [phone]  if you have not heard about the biopsies in 3 weeks.  Please also call with any specific questions about appointments or follow up tests.

## 2023-12-27 NOTE — Op Note (Signed)
[facility] Patient Name: Kathryn Church Procedure Date : 12/27/2023 MRN: [id number] Attending MD: Iva Boop , MD, [id number] Date of Birth: [birth year] CSN: [id number] Age: 58 Admit Type: Inpatient Procedure:                Colonoscopy Indications:              Surveillance: Personal history of adenomatous and                            sessile serrated polyps on last colonoscopy > 3                            years ago, Last colonoscopy: 2021 Providers:                Iva Boop, MD, Fransisca Connors, Rozetta Nunnery, Technician Referring MD:              Medicines:                Monitored Anesthesia Care Complications:            No immediate complications. Estimated Blood Loss:     Estimated blood loss was minimal. Procedure:                Pre-Anesthesia Assessment:                           - Prior to the procedure, a History and Physical                            was performed, and patient medications and                            allergies were reviewed. The patient's tolerance of                            previous anesthesia was also reviewed. The risks                            and benefits of the procedure and the sedation                            options and risks were discussed with the patient.                            All questions were answered, and informed consent                            was obtained. Prior Anticoagulants: The patient has                            taken no anticoagulant or antiplatelet agents. ASA  Grade Assessment: II - A patient with mild systemic                            disease. After reviewing the risks and benefits,                            the patient was deemed in satisfactory condition to                            undergo the procedure.                           After obtaining informed consent, the colonoscope                            was passed  under direct vision. Throughout the                            procedure, the patient's blood pressure, pulse, and                            oxygen saturations were monitored continuously. The                            CF-HQ190L ([id number]) Olympus colonoscope was                            introduced through the anus and advanced to the the                            cecum, identified by appendiceal orifice and                            ileocecal valve. The colonoscopy was performed                            without difficulty using abdominal binder. The                            patient tolerated the procedure well. The quality                            of the bowel preparation was excellent. The                            ileocecal valve, appendiceal orifice, and rectum                            were photographed. The bowel preparation used was                            SUTAB via split dose instruction. Scope In: 8:14:12 AM Scope Out: 8:27:56 AM Scope Withdrawal Time: 0 hours 9 minutes 3 seconds  Total Procedure Duration: 0 hours 13  minutes 44 seconds  Findings:      Skin tags were found on perianal exam.      Two sessile polyps were found in the descending colon and ascending       colon. The polyps were diminutive in size. These polyps were removed       with a cold snare. Resection and retrieval were complete. Verification       of patient identification for the specimen was done. Estimated blood       loss was minimal.      The exam was otherwise without abnormality. Impression:               - Perianal skin tags found on perianal exam.                           - Two diminutive polyps in the descending colon and                            in the ascending colon, removed with a cold snare.                            Resected and retrieved.                           - The examination was otherwise normal. Unable tro                            retroflex in rectum. No residual  polyp in cecum.                           - Personal history of colonic polyps. Large cecal                            ssp removed over multiple colonoscopies 2020, other                            adenomas 2019-21 also. Recommendation:           - Patient has a contact number available for                            emergencies. The signs and symptoms of potential                            delayed complications were discussed with the                            patient. Return to normal activities tomorrow.                            Written discharge instructions were provided to the                            patient.                           -  Resume previous diet.                           - Continue present medications.                           - Repeat colonoscopy is recommended for                            surveillance. The colonoscopy date will be                            determined after pathology results from today's                            exam become available for review. USE ABDOMINAL                            BINDER AGAIN Procedure Code(s):        --- Professional ---                           [phone], Colonoscopy, flexible; with removal of                            tumor(s), polyp(s), or other lesion(s) by snare                            technique Diagnosis Code(s):        --- Professional ---                           Z86.010, Personal history of colonic polyps                           D12.4, Benign neoplasm of descending colon                           D12.2, Benign neoplasm of ascending colon                           K64.4, Residual hemorrhoidal skin tags CPT copyright 2022 American Medical Association. All rights reserved. The codes documented in this report are preliminary and upon coder review may  be revised to meet current compliance requirements. Iva Boop, MD 12/27/2023 8:45:19 AM This report has been signed electronically. Number of Addenda:  0

## 2023-12-28 ENCOUNTER — Encounter (HOSPITAL_COMMUNITY): Payer: Self-pay | Admitting: Internal Medicine

## 2023-12-28 LAB — SURGICAL PATHOLOGY

## 2024-01-01 ENCOUNTER — Encounter: Payer: Self-pay | Admitting: Internal Medicine

## 2024-08-06 ENCOUNTER — Encounter (HOSPITAL_COMMUNITY): Payer: Self-pay

## 2024-08-06 ENCOUNTER — Emergency Department (HOSPITAL_COMMUNITY)

## 2024-08-06 ENCOUNTER — Emergency Department (HOSPITAL_COMMUNITY)
Admission: EM | Admit: 2024-08-06 | Discharge: 2024-08-06 | Discharge status: Against Medical Advice | Attending: Emergency Medicine | Admitting: Emergency Medicine

## 2024-08-06 ENCOUNTER — Other Ambulatory Visit: Payer: Self-pay

## 2024-08-06 DIAGNOSIS — M25512 Pain in left shoulder: Secondary | ICD-10-CM | POA: Diagnosis present

## 2024-08-06 DIAGNOSIS — Z5321 Procedure and treatment not carried out due to patient leaving prior to being seen by health care provider: Secondary | ICD-10-CM | POA: Insufficient documentation

## 2024-08-06 DIAGNOSIS — R200 Anesthesia of skin: Secondary | ICD-10-CM | POA: Insufficient documentation

## 2024-08-06 DIAGNOSIS — M542 Cervicalgia: Secondary | ICD-10-CM | POA: Diagnosis not present

## 2024-08-06 DIAGNOSIS — R202 Paresthesia of skin: Secondary | ICD-10-CM | POA: Insufficient documentation

## 2024-08-06 NOTE — ED Provider Triage Note (Signed)
 Emergency Medicine Provider Triage Evaluation Note  Kathryn Church , a 58 y.o. female  was evaluated in triage.  Pt complains of left shoulder and neck pain and has pain radiating down left arm. Last night while laying is bed > 12 hours ago, started having some numbness and tingling down left arm into 4 and 5 digit, went away with positional change.  Review of Systems  Positive: Left shoulder pain Negative:  Chest pain  Physical Exam  BP 132/71   Pulse 79   Temp 98.6 F (37 C) (Oral)   Resp 20   Ht 5' 7 (1.702 m)   Wt 68 kg   LMP 02/16/2009   SpO2 98%   BMI 23.49 kg/m  Gen:   Awake, no distress   Resp:  Normal effort MSK:   Moves extremities without difficulty  Other:  Strong radial pulse. Nonfocal neuro exam including no sesnation change of left arm and no weakness.   Medical Decision Making  Medically screening exam initiated at 12:50 PM.  Appropriate orders placed.  Kathryn Church was informed that the remainder of the evaluation will be completed by another provider, this initial triage assessment does not replace that evaluation, and the importance of remaining in the ED until their evaluation is complete.     Kathryn Warren SAILOR, PA-C 08/06/24 1252

## 2024-08-06 NOTE — ED Triage Notes (Signed)
 Pt states she was laying in bed last night when her left arm/hand started going numb. Pt reports pain starting at her scapula radiating down her arm just below her elbow that comes & goes. Denies any other s/s at time of triage.  Pt wonders if her new cholesterol medication she started 6 days ago could cause it.
